# Patient Record
Sex: Female | Born: 1950 | Race: White | Hispanic: No | Marital: Single | Smoking: Never smoker
Health system: Southern US, Community
[De-identification: ages and names within clinical notes are randomized; demographics above are authoritative.]

## PROBLEM LIST (undated history)

## (undated) DIAGNOSIS — I10 Essential (primary) hypertension: Secondary | ICD-10-CM

## (undated) DIAGNOSIS — E785 Hyperlipidemia, unspecified: Secondary | ICD-10-CM

## (undated) DIAGNOSIS — G4700 Insomnia, unspecified: Secondary | ICD-10-CM

## (undated) DIAGNOSIS — A6000 Herpesviral infection of urogenital system, unspecified: Secondary | ICD-10-CM

## (undated) DIAGNOSIS — F419 Anxiety disorder, unspecified: Secondary | ICD-10-CM

## (undated) DIAGNOSIS — E079 Disorder of thyroid, unspecified: Secondary | ICD-10-CM

## (undated) DIAGNOSIS — C801 Malignant (primary) neoplasm, unspecified: Secondary | ICD-10-CM

## (undated) HISTORY — DX: Essential (primary) hypertension: I10

## (undated) HISTORY — DX: Disorder of thyroid, unspecified: E07.9

## (undated) HISTORY — DX: Malignant (primary) neoplasm, unspecified: C80.1

## (undated) HISTORY — DX: Herpesviral infection of urogenital system, unspecified: A60.00

## (undated) HISTORY — DX: Anxiety disorder, unspecified: F41.9

## (undated) HISTORY — DX: Hypercalcemia: E83.52

## (undated) HISTORY — DX: Hyperlipidemia, unspecified: E78.5

## (undated) HISTORY — DX: Insomnia, unspecified: G47.00

## (undated) HISTORY — PX: NASAL SEPTUM SURGERY: SHX37

## (undated) HISTORY — PX: BREAST RECONSTRUCTION: SHX9

---

## 1980-11-20 HISTORY — PX: MASTECTOMY MODIFIED RADICAL: SUR848

## 1993-11-20 HISTORY — PX: TOTAL ABDOMINAL HYSTERECTOMY W/ BILATERAL SALPINGOOPHORECTOMY: SHX83

## 2001-11-20 HISTORY — PX: COLONOSCOPY: SHX174

## 2001-11-20 HISTORY — PX: KNEE SURGERY: SHX244

## 2002-11-20 HISTORY — PX: CATARACT EXTRACTION: SUR2

## 2006-11-20 HISTORY — PX: OTHER SURGICAL HISTORY: SHX169

## 2009-11-20 HISTORY — PX: BREAST IMPLANT EXCHANGE: SHX6296

## 2014-05-24 ENCOUNTER — Emergency Department (HOSPITAL_COMMUNITY): Payer: Managed Care, Other (non HMO)

## 2014-05-24 ENCOUNTER — Emergency Department (HOSPITAL_COMMUNITY)
Admission: EM | Admit: 2014-05-24 | Discharge: 2014-05-24 | Disposition: A | Discharge status: Home / Self Care | Payer: Managed Care, Other (non HMO) | Attending: Emergency Medicine | Admitting: Emergency Medicine

## 2014-05-24 ENCOUNTER — Encounter (HOSPITAL_COMMUNITY): Payer: Self-pay | Admitting: Emergency Medicine

## 2014-05-24 DIAGNOSIS — Z880 Allergy status to penicillin: Secondary | ICD-10-CM | POA: Insufficient documentation

## 2014-05-24 DIAGNOSIS — Y9389 Activity, other specified: Secondary | ICD-10-CM | POA: Insufficient documentation

## 2014-05-24 DIAGNOSIS — S0101XA Laceration without foreign body of scalp, initial encounter: Secondary | ICD-10-CM

## 2014-05-24 DIAGNOSIS — R296 Repeated falls: Secondary | ICD-10-CM | POA: Insufficient documentation

## 2014-05-24 DIAGNOSIS — R42 Dizziness and giddiness: Secondary | ICD-10-CM | POA: Insufficient documentation

## 2014-05-24 DIAGNOSIS — Z79899 Other long term (current) drug therapy: Secondary | ICD-10-CM | POA: Insufficient documentation

## 2014-05-24 DIAGNOSIS — W19XXXA Unspecified fall, initial encounter: Secondary | ICD-10-CM

## 2014-05-24 DIAGNOSIS — Y929 Unspecified place or not applicable: Secondary | ICD-10-CM | POA: Insufficient documentation

## 2014-05-24 DIAGNOSIS — Z23 Encounter for immunization: Secondary | ICD-10-CM | POA: Insufficient documentation

## 2014-05-24 DIAGNOSIS — S0100XA Unspecified open wound of scalp, initial encounter: Secondary | ICD-10-CM | POA: Insufficient documentation

## 2014-05-24 LAB — BASIC METABOLIC PANEL
Anion gap: 18 — ABNORMAL HIGH (ref 5–15)
BUN: 15 mg/dL (ref 6–23)
CO2: 22 mEq/L (ref 19–32)
CREATININE: 1.03 mg/dL (ref 0.50–1.10)
Calcium: 9.5 mg/dL (ref 8.4–10.5)
Chloride: 94 mEq/L — ABNORMAL LOW (ref 96–112)
GFR, EST AFRICAN AMERICAN: 66 mL/min — AB (ref >90)
GFR, EST NON AFRICAN AMERICAN: 57 mL/min — AB (ref >90)
Glucose, Bld: 109 mg/dL — ABNORMAL HIGH (ref 70–99)
Potassium: 3.5 mEq/L — ABNORMAL LOW (ref 3.7–5.3)
Sodium: 134 mEq/L — ABNORMAL LOW (ref 137–147)

## 2014-05-24 LAB — CBC WITH DIFFERENTIAL/PLATELET
Basophils Absolute: 0 10*3/uL (ref 0.0–0.1)
Basophils Relative: 0 % (ref 0–1)
Eosinophils Absolute: 0.2 10*3/uL (ref 0.0–0.7)
Eosinophils Relative: 4 % (ref 0–5)
HEMATOCRIT: 35.1 % — AB (ref 36.0–46.0)
HEMOGLOBIN: 12.2 g/dL (ref 12.0–15.0)
LYMPHS ABS: 2.4 10*3/uL (ref 0.7–4.0)
LYMPHS PCT: 36 % (ref 12–46)
MCH: 30.4 pg (ref 26.0–34.0)
MCHC: 34.8 g/dL (ref 30.0–36.0)
MCV: 87.5 fL (ref 78.0–100.0)
MONO ABS: 1 10*3/uL (ref 0.1–1.0)
MONOS PCT: 15 % — AB (ref 3–12)
NEUTROS PCT: 45 % (ref 43–77)
Neutro Abs: 2.9 10*3/uL (ref 1.7–7.7)
Platelets: 313 10*3/uL (ref 150–400)
RBC: 4.01 MIL/uL (ref 3.87–5.11)
RDW: 13.2 % (ref 11.5–15.5)
WBC: 6.5 10*3/uL (ref 4.0–10.5)

## 2014-05-24 LAB — TROPONIN I

## 2014-05-24 IMAGING — CT CT HEAD W/O CM
2 series · 17 of 30 positions shown, 20 images · non-contrast
Comparison: None.

CLINICAL DATA: Posterior head laceration and dizziness following a
fall.

EXAM:
CT HEAD WITHOUT CONTRAST
TECHNIQUE: Contiguous axial images were obtained from the base of the skull
through the vertex without intravenous contrast.

[Series 2: head w/o · axial · non-contrast · 0.43mm/px · z∈[+8,+122]mm · 9 of 29 slices shown, 12 images]
[im 3/29  brain]
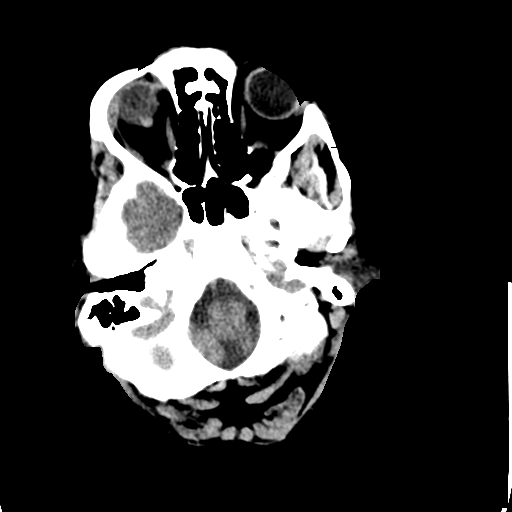
[im 3/29  bone]
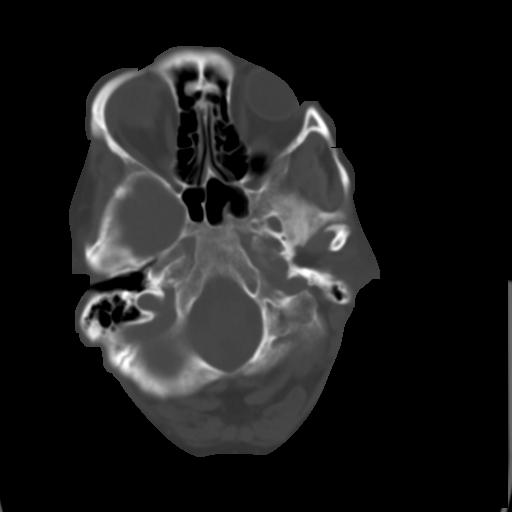
[im 6/29  brain]
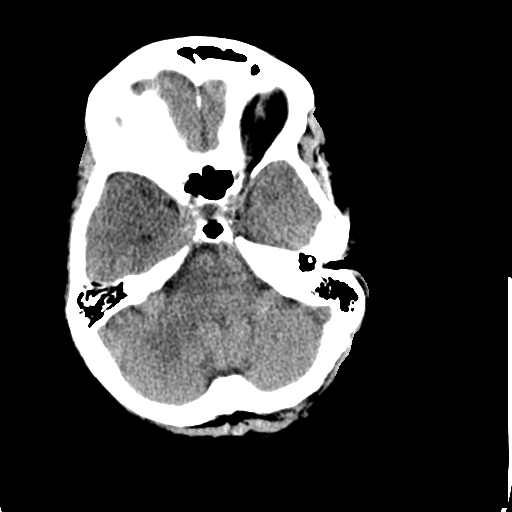
[im 9/29  brain]
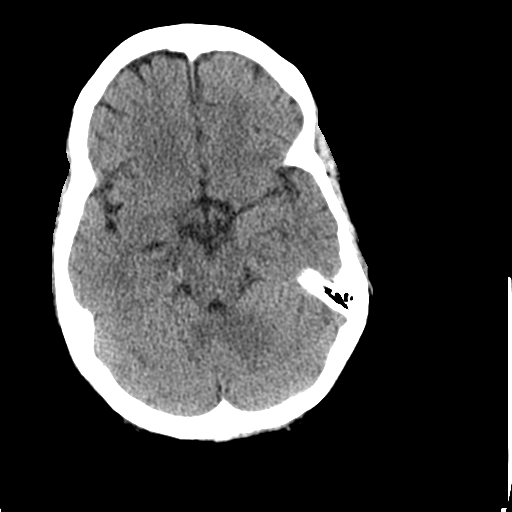
[im 12/29  brain]
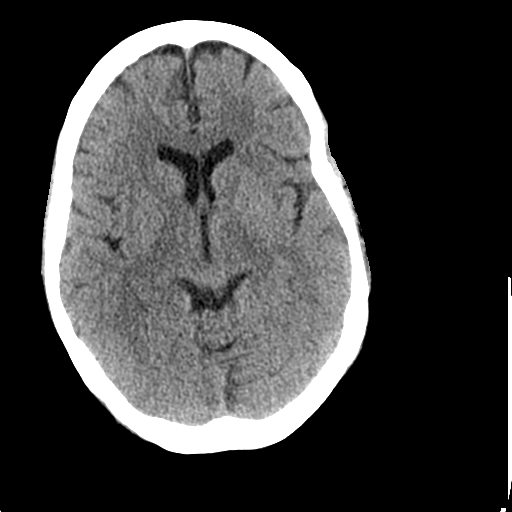
[im 15/29  brain]
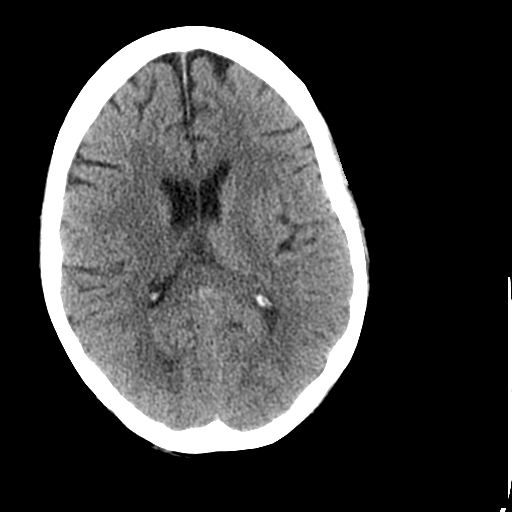
[im 15/29  bone]
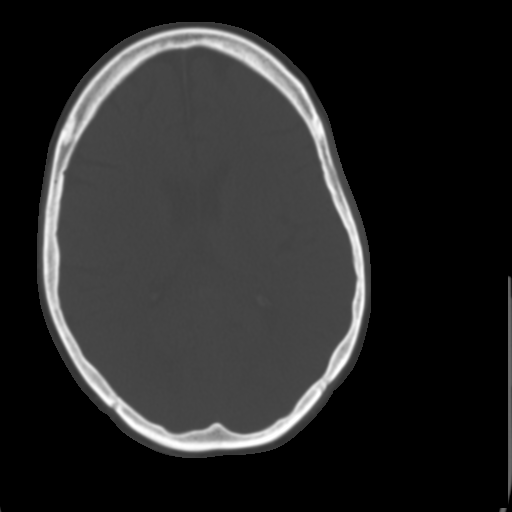
[im 17/29  brain]
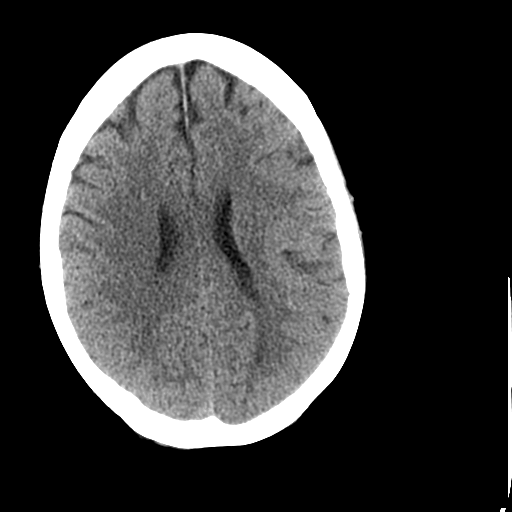
[im 20/29  brain]
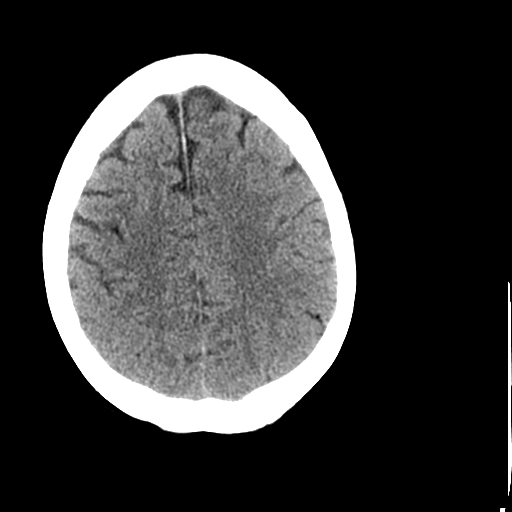
[im 23/29  brain]
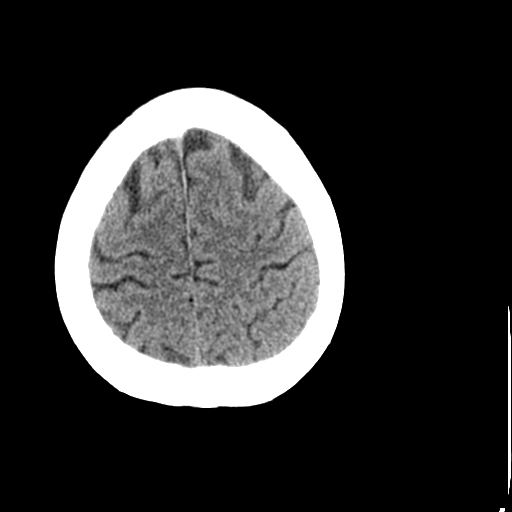
[im 26/29  brain]
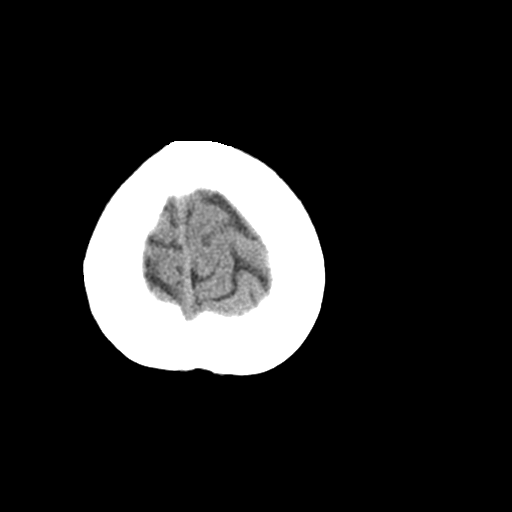
[im 26/29  bone]
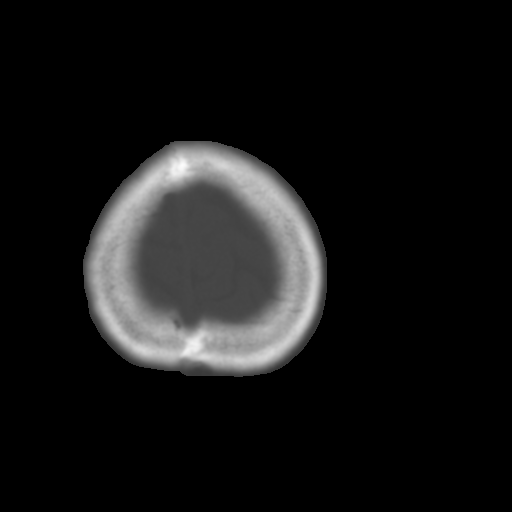

[Series 3: bone windows · axial · 0.43mm/px · z∈[+12,+120]mm · 8 of 48 slices shown]
[im 6/48  bone]
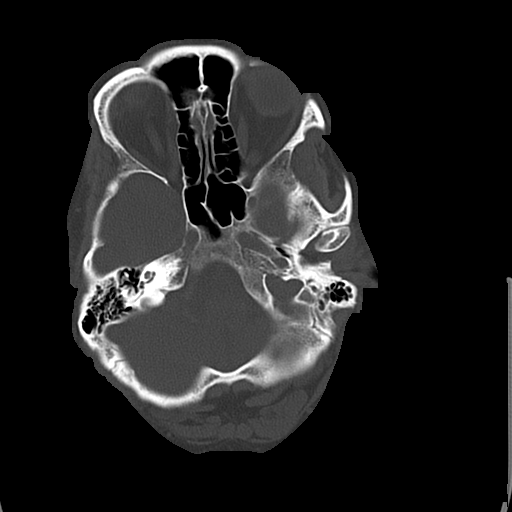
[im 11/48  bone]
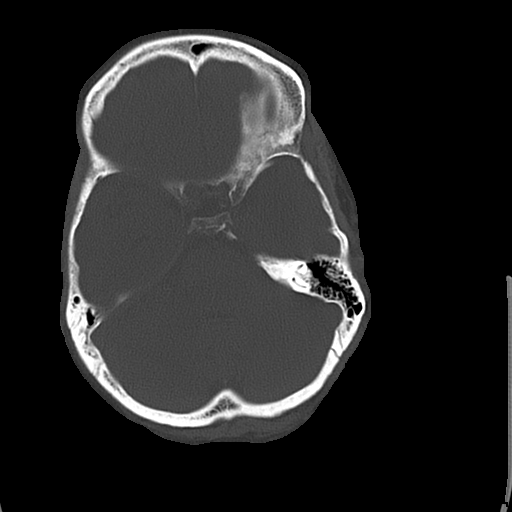
[im 16/48  bone]
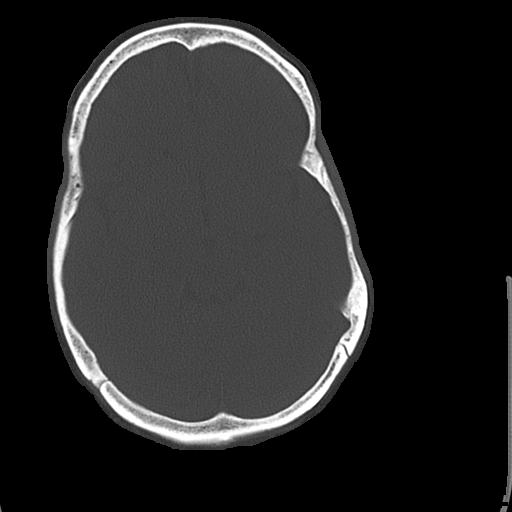
[im 21/48  bone]
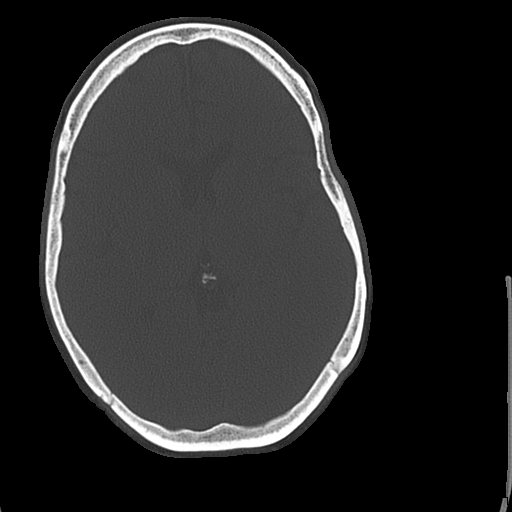
[im 27/48  bone]
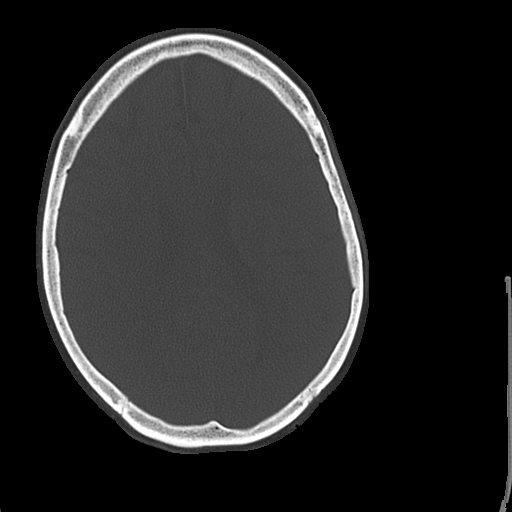
[im 32/48  bone]
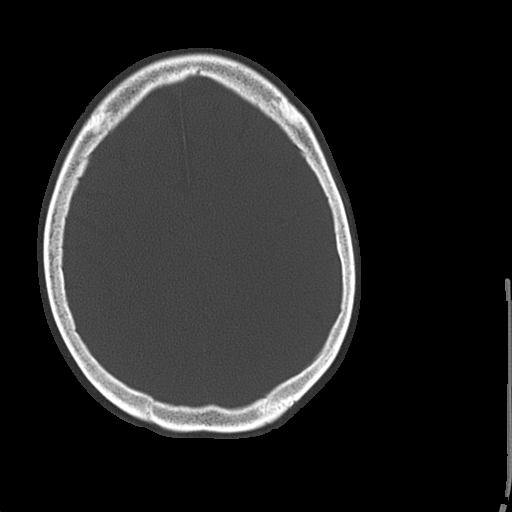
[im 37/48  bone]
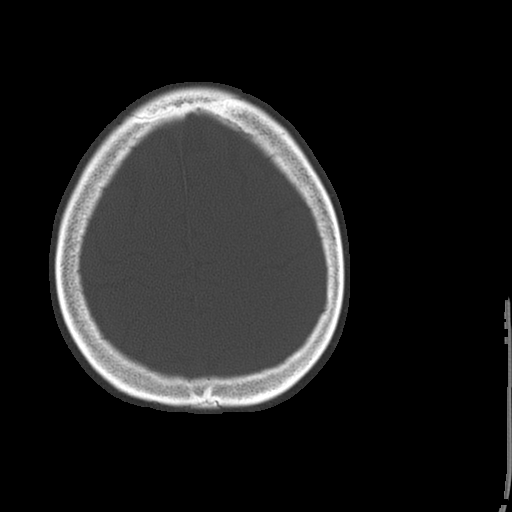
[im 42/48  bone]
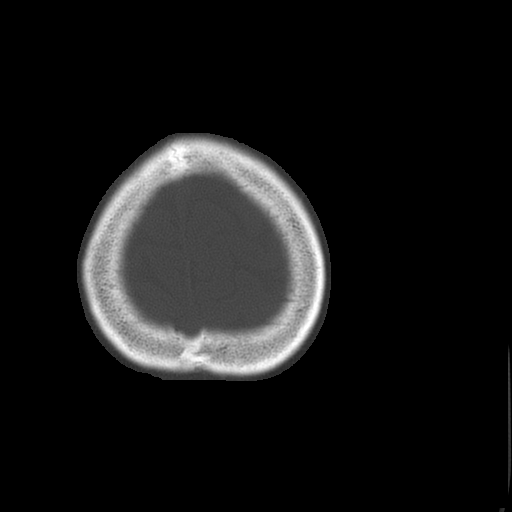

[17 of 30 positions shown; findings below may reference images not displayed]

FINDINGS: Mildly enlarged ventricles and subarachnoid spaces. Left posterior
scalp laceration with a small calcific density and small amount of
soft tissue air. No skull fracture, intracranial hemorrhage or
paranasal sinus air-fluid levels.
IMPRESSION: 1. Left posterior scalp laceration with a tiny calcific superficial
foreign body.
2. No skull fracture or intracranial hemorrhage.
3. Mild atrophy.

## 2014-05-24 MED ORDER — TETANUS-DIPHTH-ACELL PERTUSSIS 5-2.5-18.5 LF-MCG/0.5 IM SUSP
0.5000 mL | Freq: Once | INTRAMUSCULAR | Status: AC
  Administered 2014-05-24: 0.5 mL via INTRAMUSCULAR
  Filled 2014-05-24: qty 0.5

## 2014-05-24 MED ORDER — SODIUM CHLORIDE 0.9 % IV BOLUS (SEPSIS)
1000.0000 mL | INTRAVENOUS | Status: AC
  Administered 2014-05-24: 1000 mL via INTRAVENOUS

## 2014-05-24 NOTE — ED Notes (Signed)
Pt arrived to the ED with a complaint of a fall with a posterior head laceration.  Pt had two glasses of wine and got up caught her foot and fell backwards into the molding inside her house.  Pt got up feeling dizzy.  Pt has a 3 cm laceration to the posterior of her head.  Pt is hypotensive in triage.  Pt denies LOC.  Pt denies pain

## 2014-05-24 NOTE — ED Notes (Signed)
Patient transported to CT 

## 2014-05-24 NOTE — ED Provider Notes (Signed)
CSN: [id number]     Arrival date & time 05/24/14  1938 History   First MD Initiated Contact with Patient 05/24/14 2107     Chief Complaint  Patient presents with  . Fall     (Consider location/radiation/quality/duration/timing/severity/associated sxs/prior Treatment) Patient is a 63 y.o. female presenting with fall. The history is provided by the patient.  Fall This is a new problem. The current episode started 1 to 2 hours ago. Episode frequency: once. The problem has been resolved. Pertinent negatives include no chest pain, no abdominal pain, no headaches and no shortness of breath. Nothing aggravates the symptoms. Nothing relieves the symptoms. She has tried nothing for the symptoms. The treatment provided no relief.    No past medical history on file. No past surgical history on file. No family history on file. History  Substance Use Topics  . Smoking status: Never Smoker   . Smokeless tobacco: Not on file  . Alcohol Use: Yes   OB History   Grav Para Term Preterm Abortions TAB SAB Ect Mult Living                 Review of Systems  Constitutional: Negative for fever and fatigue.  HENT: Negative for congestion and drooling.   Eyes: Negative for pain.  Respiratory: Negative for cough and shortness of breath.   Cardiovascular: Negative for chest pain.  Gastrointestinal: Negative for nausea, vomiting, abdominal pain and diarrhea.  Genitourinary: Negative for dysuria and hematuria.  Musculoskeletal: Negative for back pain, gait problem and neck pain.  Skin: Negative for color change.  Neurological: Negative for dizziness and headaches.  Hematological: Negative for adenopathy.  Psychiatric/Behavioral: Negative for behavioral problems.  All other systems reviewed and are negative.     Allergies  Penicillins and Sulfa antibiotics  Home Medications   Prior to Admission medications   Medication Sig Start Date End Date Taking? Authorizing Provider  amLODipine (NORVASC) 5  MG tablet Take 5 mg by mouth daily.   Yes Historical Provider, MD  candesartan-hydrochlorothiazide (ATACAND HCT) 16-12.5 MG per tablet Take 2 tablets by mouth daily.   Yes Historical Provider, MD  levothyroxine (SYNTHROID, LEVOTHROID) 25 MCG tablet Take 25 mcg by mouth daily before breakfast.   Yes Historical Provider, MD  lovastatin (MEVACOR) 10 MG tablet Take 10 mg by mouth daily.   Yes Historical Provider, MD   BP 93/55  Pulse 81  Temp(Src) 98.1 F (36.7 C) (Oral)  Resp 24  Ht 5\' 2"  (1.575 m)  Wt 137 lb (62.143 kg)  BMI 25.05 kg/m2  SpO2 97% Physical Exam  Nursing note and vitals reviewed. Constitutional: She is oriented to person, place, and time. She appears well-developed and well-nourished.  HENT:  Head: Normocephalic.  Mouth/Throat: No oropharyngeal exudate.  4 cm vertical laceration to the posterior aspect of the head.  Eyes: Conjunctivae and EOM are normal. Pupils are equal, round, and reactive to light.  Neck: Normal range of motion. Neck supple.  Cardiovascular: Normal rate, regular rhythm, normal heart sounds and intact distal pulses.  Exam reveals no gallop and no friction rub.   No murmur heard. Pulmonary/Chest: Effort normal and breath sounds normal. No respiratory distress. She has no wheezes.  Abdominal: Soft. Bowel sounds are normal. There is no tenderness. There is no rebound and no guarding.  Musculoskeletal: Normal range of motion. She exhibits no edema and no tenderness.  Neurological: She is alert and oriented to person, place, and time.  alert, oriented x3 speech: normal in context and  clarity memory: intact grossly cranial nerves II-XII: intact motor strength: full proximally and distally no involuntary movements or tremors sensation: intact to light touch diffusely  cerebellar: finger-to-nose and heel-to-shin intact gait: normal forwards and backwards, normal tandem gait   Skin: Skin is warm and dry.  Psychiatric: She has a normal mood and affect.  Her behavior is normal.    ED Course  LACERATION REPAIR Date/Time: 05/25/2014 7:37 PM Performed by: Pamella Pert, S Authorized by: Pamella Pert, S Consent: Verbal consent obtained. written consent not obtained. Risks and benefits: risks, benefits and alternatives were discussed Consent given by: patient Patient understanding: patient states understanding of the procedure being performed Required items: required blood products, implants, devices, and special equipment available Patient identity confirmed: verbally with patient, arm band, provided demographic data and hospital-assigned identification number Body area: head/neck Location details: scalp Laceration length: 4 cm Foreign bodies: no foreign bodies Tendon involvement: none Nerve involvement: none Vascular damage: no Anesthesia: local infiltration Local anesthetic: lidocaine 1% with epinephrine Patient sedated: no Preparation: Patient was prepped and draped in the usual sterile fashion. Irrigation solution: saline Irrigation method: syringe Amount of cleaning: standard Debridement: none Degree of undermining: none Skin closure: staples Number of sutures: 4 Patient tolerance: Patient tolerated the procedure well with no immediate complications.   (including critical care time) Labs Review Labs Reviewed  CBC WITH DIFFERENTIAL - Abnormal; Notable for the following:    HCT 35.1 (*)    Monocytes Relative 15 (*)    All other components within normal limits  BASIC METABOLIC PANEL - Abnormal; Notable for the following:    Sodium 134 (*)    Potassium 3.5 (*)    Chloride 94 (*)    Glucose, Bld 109 (*)    GFR calc non Af Amer 57 (*)    GFR calc Af Amer 66 (*)    Anion gap 18 (*)    All other components within normal limits  TROPONIN I    Imaging Review Ct Head Wo Contrast  05/24/2014   CLINICAL DATA:  Posterior head laceration and dizziness following a fall.  EXAM: CT HEAD WITHOUT CONTRAST  TECHNIQUE:  Contiguous axial images were obtained from the base of the skull through the vertex without intravenous contrast.  COMPARISON:  None.  FINDINGS: Mildly enlarged ventricles and subarachnoid spaces. Left posterior scalp laceration with a small calcific density and small amount of soft tissue air. No skull fracture, intracranial hemorrhage or paranasal sinus air-fluid levels.  IMPRESSION: 1. Left posterior scalp laceration with a tiny calcific superficial foreign body. 2. No skull fracture or intracranial hemorrhage. 3. Mild atrophy.   Electronically Signed   By: Enrique Sack M.D.   On: 05/24/2014 22:09     EKG Interpretation   Date/Time:  Sunday May 24 2014 19:51:20 EDT Ventricular Rate:  93 PR Interval:  186 QRS Duration: 62 QT Interval:  340 QTC Calculation: 423 R Axis:   23 Text Interpretation:  Sinus rhythm LAE, consider biatrial enlargement no  previous for comparison Confirmed by Miquel Lamson  MD, Rekha Hobbins ([id number]) on  05/24/2014 9:14:59 PM      MDM   Final diagnoses:  Fall, initial encounter  Laceration of scalp, initial encounter    9:44 PM 63 y.o. female with a history of hypertension who presents with a mechanical fall. She states that she had 2 glasses of wine and was getting up from a recliner when her foot got caught and she fell backwards hitting her head on the ground. She  denies any loss of consciousness. She does have a 4 cm laceration to the posterior aspect of her head. She was initially hypotensive in triage but her blood pressure is improving without treatment and on my exam and is currently 95/60. She does have a history of hypertension and takes blood pressure medicines. She currently has some mild burning pain where the laceration is but denies any other pain. She denies any chest pain or shortness of breath. She is afebrile and vital signs are otherwise unremarkable. Will get orthostatics, IV fluid, and screening labs. Patient has otherwise been well and I doubt there is an  underlying infectious process.  Repaired lac to posterior scalp w/ 4 staples. Calcific density noted on CT. This was not visualized on my exam, but the wound was explored and irrigated thoroughly w/out evidence of fb.   11:27 PM: BP improved w/ IVF. Possibly a component of dehydration. Pt is well appearing and jovial on exam. I interpreted/reviewed the labs and/or imaging which were non-contributory.  I have discussed the diagnosis/risks/treatment options with the patient and believe the pt to be eligible for discharge home to follow-up with pcp this week to review her BP meds and to keep a log of her BP's. We also discussed returning to the ED immediately if new or worsening sx occur. We discussed the sx which are most concerning (e.g., recurrent falls, severe HA, fever) that necessitate immediate return. Medications administered to the patient during their visit and any new prescriptions provided to the patient are listed below.  Medications given during this visit Medications  Tdap (BOOSTRIX) injection 0.5 mL (0.5 mLs Intramuscular Given 05/24/14 2124)  sodium chloride 0.9 % bolus 1,000 mL (0 mLs Intravenous Stopped 05/24/14 2248)    New Prescriptions   No medications on file     Blanchard Kelch, MD 05/25/14 1942

## 2014-07-31 ENCOUNTER — Ambulatory Visit: Payer: Managed Care, Other (non HMO) | Admitting: Internal Medicine

## 2014-08-11 ENCOUNTER — Encounter: Payer: Self-pay | Admitting: Internal Medicine

## 2014-08-11 ENCOUNTER — Ambulatory Visit (INDEPENDENT_AMBULATORY_CARE_PROVIDER_SITE_OTHER): Payer: Managed Care, Other (non HMO) | Admitting: Internal Medicine

## 2014-08-11 DIAGNOSIS — E039 Hypothyroidism, unspecified: Secondary | ICD-10-CM

## 2014-08-11 MED ORDER — LEVOTHYROXINE SODIUM 75 MCG PO TABS: 75.0000 ug | ORAL_TABLET | Freq: Every day | ORAL | Status: AC

## 2014-08-11 MED ORDER — CANDESARTAN CILEXETIL 32 MG PO TABS: 32.0000 mg | ORAL_TABLET | Freq: Every day | ORAL | Status: DC

## 2014-08-11 NOTE — Progress Notes (Signed)
Patient ID: Brooke Dudley, female   DOB: 17-Nov-1951, 63 y.o.   MRN: [id number]   HPI  Brooke Dudley is a 63 y.o.-year-old female, referred by her PCP, Dr. Orland Penman, in consultation for hypercalcemia and for management of hypothyroidism.  Pt was dx with hypercalcemia several years ago (?10), before moving here from [place].  I reviewed pt's pertinent labs: 07/12/2014: Ca 10.7 (8.6-10.3), ionized calcium 5.4 (4.5-5.6); iPTH 22 Lab Results  Component Value Date   CALCIUM 9.5 05/24/2014   Pt is on HCTZ 12.5 mg daily for many years!  No h/o vitamin D deficiency. No available vit D levels.  Pt is on on calcium and vitamin D (on MVI)  No h/o osteoporosis, had a L ankle fracture 12/2012. Last DEXA 2013 - normal.   No h/o kidney stones.  No h/o CKD. Last BUN/Cr (07/12/2014): 8/0.97. Previously Lab Results  Component Value Date   BUN 15 05/24/2014   CREATININE 1.03 05/24/2014   She lost ~40 lbs over 4 years - 10-12 lbs in last year. She was started on antihypertensives before she lost the weight and now BP on the low side. She had an episode of LOC 2/2 dehydration in 05/2014.  She has a h/o Breast cancer s/p R mastectomy 1982 and L 2008 (prophylactic)  Pt does not have a FH of hypercalcemia, pituitary tumors, thyroid cancer.   She also has hypothyroidism. - dx in ~2005 - last TSH (07/12/2014): 0.44 - On LT4 25 mcg daily, taken: - fasting - in am - >30 min before b'fast - no PPI, Ca, Fe - takes MVI around the same time  ROS: Constitutional: no weight gain/loss, no fatigue, no subjective hyperthermia/hypothermia, + poor sleep Eyes: no blurry vision, no xerophthalmia ENT: no sore throat, no nodules palpated in throat, no dysphagia/odynophagia, no hoarseness Cardiovascular: no CP/SOB/palpitations/leg swelling Respiratory: no cough/SOB Gastrointestinal: no N/V/D/C Musculoskeletal: no muscle/joint aches Skin: no rashes, + easy bruising Neurological: no  tremors/numbness/tingling/dizziness Psychiatric: no depression/+ anxiety   History   Social History  . Marital Status: Single    Spouse Name: N/A    Number of Children: 0   Occupational History  . Retired drug rep   Social History Main Topics  . Smoking status: Never Smoker   . Smokeless tobacco: Not on file  . Alcohol Use: Yes, occasionally  . Drug Use: No  Exercise: swimming, will start yoga  Past Medical History  Diagnosis Date  . Hypertension   . Hyperlipidemia   . Anxiety   . Insomnia   . Hypercalcemia   . Cancer     Breast  . Genital herpes   . Thyroid disease     hypothyroidism    Past Surgical History  Procedure Laterality Date  . Mastectomy modified radical  1982    Right breast  . Knee surgery  2003  . Cataract extraction  2004    both eyes  . Colonoscopy  2003  . Total abdominal hysterectomy w/ bilateral salpingoophorectomy  1995    dysplasia  . Prophylactic mastectomy  2008    left breast  . Nasal septum surgery    . Breast reconstruction      Right breast   . Breast implant exchange  2011   Current Outpatient Prescriptions on File Prior to Visit  Medication Sig Dispense Refill  . amLODipine (NORVASC) 5 MG tablet Take 5 mg by mouth daily.      . candesartan-hydrochlorothiazide (ATACAND HCT) 16-12.5 MG per tablet Take 2 tablets by mouth daily.      Marland Kitchen  levothyroxine (SYNTHROID, LEVOTHROID) 25 MCG tablet Take 25 mcg by mouth daily before breakfast.      . lovastatin (MEVACOR) 10 MG tablet Take 10 mg by mouth daily.       No current facility-administered medications on file prior to visit.   Allergies  Allergen Reactions  . Penicillins Rash  . Sulfa Antibiotics Rash and Hives   FH: see HPI  PE: BP 106/76  Pulse 91  Temp(Src) 98.2 F (36.8 C) (Oral)  Resp 12  Ht 5\' 2"  (1.575 m)  Wt 147 lb 12.8 oz (67.042 kg)  BMI 27.03 kg/m2  SpO2 97% Wt Readings from Last 3 Encounters:  08/11/14 147 lb 12.8 oz (67.042 kg)  05/24/14 137 lb (62.143  kg)   Constitutional: slightly overweight, in NAD. No kyphosis. Eyes: PERRLA, EOMI, no exophthalmos ENT: moist mucous membranes, no thyromegaly, no cervical lymphadenopathy Cardiovascular: RRR, No MRG Respiratory: CTA B Gastrointestinal: abdomen soft, NT, ND, BS+ Musculoskeletal: no deformities, strength intact in all 4 Skin: moist, warm, no rashes Neurological: no tremor with outstretched hands, DTR normal in all 4  Assessment: 1. Hypercalcemia  2. Hypothyroidism  Plan: 1. Hypercalcemia Patient had a recent elevated calcium, however, the ionized calcium was normal and the PTH was low-normal, ruling out an parathyroid problem. It is unclear whether she has vitamin D deficiency (but I would expect the PTH to be higher if she had low vitamin D). No apparent complications from hypercalcemia: no h/o nephrolithiasis, no osteoporosis. No constipation, abdominal pain, depression, bone pain. - I discussed with the patient about the physiology of calcium and parathyroid hormone and explained that HCTZ is the most likely cause for her slightly elevated calcium and low normal PTH, however, we need to stop HCTZ first and recheck the labs to make sure. She agrees to do this and I called in Candesartan to her pharmacy. I did not start Lasix, but advised her to check her BP at home and let me know if BP > 135/80 - we may need 20 mg Lasix daily in that case.  - I will check a calcium level in 6 weeks after stopping HCTZ - I will see the patient back if the above labs are abnormal  2. Hypothyroidism - I advised her to take the thyroid hormone every day, with water, >30 minutes before breakfast, separated by >4 hours from acid reflux medications, calcium, iron, multivitamins. She will move the MVI at night. - will repeat the TFTs when she comes back for the calcium check.  Orders Placed This Encounter  Procedures  . TSH  . T4, free  . Calcium, ionized  . Basic metabolic panel

## 2014-08-11 NOTE — Patient Instructions (Addendum)
Please stop Atacand and start Candesartan 32 mg daily. Check your BP at home and call me if the readings are >135/80. Please come back for labs in ~6 weeks. We will schedule another visits if the labs are abnormal.

## 2014-08-14 ENCOUNTER — Ambulatory Visit: Payer: Managed Care, Other (non HMO) | Admitting: Internal Medicine

## 2014-08-14 DIAGNOSIS — E039 Hypothyroidism, unspecified: Secondary | ICD-10-CM | POA: Insufficient documentation

## 2014-08-27 ENCOUNTER — Other Ambulatory Visit: Payer: Self-pay | Admitting: *Deleted

## 2014-08-27 ENCOUNTER — Telehealth: Payer: Self-pay | Admitting: *Deleted

## 2014-08-27 MED ORDER — CANDESARTAN CILEXETIL 32 MG PO TABS: 32.0000 mg | ORAL_TABLET | Freq: Every day | ORAL | Status: DC

## 2014-08-27 NOTE — Telephone Encounter (Signed)
OK to refill with 3 refills. 

## 2014-08-27 NOTE — Telephone Encounter (Signed)
Done

## 2014-08-27 NOTE — Telephone Encounter (Signed)
Please read note below and advise if ok to refill.

## 2014-09-03 ENCOUNTER — Telehealth: Payer: Self-pay | Admitting: Internal Medicine

## 2014-09-03 NOTE — Telephone Encounter (Signed)
Pt calling regarding the med change for adican 30 pillls she does not have enough to hold her until her next appt she will need 46 more pills to last her

## 2014-09-03 NOTE — Telephone Encounter (Signed)
Called pt and lvm advising her that her rx sent to the pharmacy on 08/27/14 was for 30 tablets with 3 refills. Advised pt that she should have enough to last until her next appt. Advised pt to call her pharmacy.

## 2014-09-11 ENCOUNTER — Telehealth: Payer: Self-pay | Admitting: Internal Medicine

## 2014-09-11 NOTE — Telephone Encounter (Signed)
Called pharmacy to be sure that the dosage directions were the same as to what  Dr Cruzita Lederer sent in on 10/8. It was. Returned call to pt and left a detailed message for her. Her old rx for Atacand was 2 tabs 16 mg 12.5. This is not what Dr Cruzita Lederer sent in.

## 2014-09-11 NOTE — Telephone Encounter (Signed)
Patient is very confused on her medication  Atacand 2 tabs 16 mg 12.5 plus diuretic   Her pharmacy has Atacand 1 tab 16 mg 12.5 [facility]   Please advise patient  She states that she is driving on the road and if she cannot answer please leave a detailed message   Thank you

## 2014-09-22 ENCOUNTER — Other Ambulatory Visit: Payer: Managed Care, Other (non HMO)

## 2014-09-23 ENCOUNTER — Other Ambulatory Visit: Payer: Managed Care, Other (non HMO)

## 2014-11-23 ENCOUNTER — Telehealth: Payer: Self-pay | Admitting: Internal Medicine

## 2014-11-25 NOTE — Telephone Encounter (Signed)
error 

## 2014-11-30 ENCOUNTER — Other Ambulatory Visit (INDEPENDENT_AMBULATORY_CARE_PROVIDER_SITE_OTHER): Payer: Managed Care, Other (non HMO)

## 2014-11-30 ENCOUNTER — Other Ambulatory Visit: Payer: Managed Care, Other (non HMO)

## 2014-11-30 DIAGNOSIS — E039 Hypothyroidism, unspecified: Secondary | ICD-10-CM

## 2014-11-30 LAB — T4, FREE: Free T4: 0.71 ng/dL (ref 0.60–1.60)

## 2014-11-30 LAB — TSH: TSH: 2.99 u[IU]/mL (ref 0.35–4.50)

## 2014-11-30 LAB — BASIC METABOLIC PANEL
BUN: 14 mg/dL (ref 6–23)
CO2: 25 mEq/L (ref 19–32)
Calcium: 9.8 mg/dL (ref 8.4–10.5)
Chloride: 99 mEq/L (ref 96–112)
Creatinine, Ser: 0.8 mg/dL (ref 0.4–1.2)
GFR: 77 mL/min (ref >60.00)
Glucose, Bld: 104 mg/dL — ABNORMAL HIGH (ref 70–99)
Potassium: 4.1 mEq/L (ref 3.5–5.1)
SODIUM: 135 meq/L (ref 135–145)

## 2014-12-01 ENCOUNTER — Encounter: Payer: Self-pay | Admitting: *Deleted

## 2014-12-01 LAB — CALCIUM, IONIZED: Calcium, Ion: 1.27 mmol/L (ref 1.12–1.32)

## 2014-12-03 ENCOUNTER — Telehealth: Payer: Self-pay | Admitting: Internal Medicine

## 2014-12-03 ENCOUNTER — Other Ambulatory Visit: Payer: Self-pay | Admitting: Internal Medicine

## 2014-12-03 NOTE — Telephone Encounter (Signed)
Faxed labs to Dr Nnodi's office [phone].

## 2014-12-03 NOTE — Telephone Encounter (Signed)
Please forward labs to Dr. Orland Penman

## 2015-03-06 ENCOUNTER — Other Ambulatory Visit: Payer: Self-pay | Admitting: Internal Medicine

## 2015-03-06 NOTE — Telephone Encounter (Signed)
Please communicate to pt that further refills are per PCP.

## 2016-02-07 ENCOUNTER — Other Ambulatory Visit: Payer: Self-pay | Admitting: Obstetrics & Gynecology

## 2016-02-07 DIAGNOSIS — N6320 Unspecified lump in the left breast, unspecified quadrant: Secondary | ICD-10-CM

## 2016-02-11 ENCOUNTER — Ambulatory Visit
Admission: RE | Admit: 2016-02-11 | Discharge: 2016-02-11 | Disposition: A | Discharge status: Home / Self Care | Payer: Managed Care, Other (non HMO) | Source: Ambulatory Visit | Attending: Obstetrics & Gynecology | Admitting: Obstetrics & Gynecology

## 2016-02-11 DIAGNOSIS — N6320 Unspecified lump in the left breast, unspecified quadrant: Secondary | ICD-10-CM

## 2017-01-05 DIAGNOSIS — L820 Inflamed seborrheic keratosis: Secondary | ICD-10-CM | POA: Diagnosis not present

## 2017-01-05 DIAGNOSIS — L821 Other seborrheic keratosis: Secondary | ICD-10-CM | POA: Diagnosis not present

## 2017-01-05 DIAGNOSIS — L918 Other hypertrophic disorders of the skin: Secondary | ICD-10-CM | POA: Diagnosis not present

## 2017-07-25 DIAGNOSIS — Z23 Encounter for immunization: Secondary | ICD-10-CM | POA: Diagnosis not present

## 2017-07-25 DIAGNOSIS — H938X2 Other specified disorders of left ear: Secondary | ICD-10-CM | POA: Diagnosis not present

## 2017-07-25 DIAGNOSIS — R0981 Nasal congestion: Secondary | ICD-10-CM | POA: Diagnosis not present

## 2017-07-30 DIAGNOSIS — Z0000 Encounter for general adult medical examination without abnormal findings: Secondary | ICD-10-CM | POA: Diagnosis not present

## 2017-07-30 DIAGNOSIS — Z86000 Personal history of in-situ neoplasm of breast: Secondary | ICD-10-CM | POA: Diagnosis not present

## 2017-07-30 DIAGNOSIS — E039 Hypothyroidism, unspecified: Secondary | ICD-10-CM | POA: Diagnosis not present

## 2017-07-30 DIAGNOSIS — E785 Hyperlipidemia, unspecified: Secondary | ICD-10-CM | POA: Diagnosis not present

## 2017-07-30 DIAGNOSIS — E663 Overweight: Secondary | ICD-10-CM | POA: Diagnosis not present

## 2017-07-30 DIAGNOSIS — H6123 Impacted cerumen, bilateral: Secondary | ICD-10-CM | POA: Diagnosis not present

## 2017-07-30 DIAGNOSIS — A6004 Herpesviral vulvovaginitis: Secondary | ICD-10-CM | POA: Diagnosis not present

## 2017-07-30 DIAGNOSIS — I10 Essential (primary) hypertension: Secondary | ICD-10-CM | POA: Diagnosis not present

## 2017-07-30 DIAGNOSIS — Z23 Encounter for immunization: Secondary | ICD-10-CM | POA: Diagnosis not present

## 2017-07-30 DIAGNOSIS — J309 Allergic rhinitis, unspecified: Secondary | ICD-10-CM | POA: Diagnosis not present

## 2017-07-30 DIAGNOSIS — G4700 Insomnia, unspecified: Secondary | ICD-10-CM | POA: Diagnosis not present

## 2017-07-30 DIAGNOSIS — Z780 Asymptomatic menopausal state: Secondary | ICD-10-CM | POA: Diagnosis not present

## 2017-12-27 DIAGNOSIS — M8588 Other specified disorders of bone density and structure, other site: Secondary | ICD-10-CM | POA: Diagnosis not present

## 2017-12-27 DIAGNOSIS — E2839 Other primary ovarian failure: Secondary | ICD-10-CM | POA: Diagnosis not present

## 2018-03-13 DIAGNOSIS — L814 Other melanin hyperpigmentation: Secondary | ICD-10-CM | POA: Diagnosis not present

## 2018-03-13 DIAGNOSIS — L821 Other seborrheic keratosis: Secondary | ICD-10-CM | POA: Diagnosis not present

## 2018-03-13 DIAGNOSIS — L570 Actinic keratosis: Secondary | ICD-10-CM | POA: Diagnosis not present

## 2018-03-13 DIAGNOSIS — L918 Other hypertrophic disorders of the skin: Secondary | ICD-10-CM | POA: Diagnosis not present

## 2018-03-13 DIAGNOSIS — L819 Disorder of pigmentation, unspecified: Secondary | ICD-10-CM | POA: Diagnosis not present

## 2018-03-13 DIAGNOSIS — D2261 Melanocytic nevi of right upper limb, including shoulder: Secondary | ICD-10-CM | POA: Diagnosis not present

## 2018-08-22 DIAGNOSIS — L821 Other seborrheic keratosis: Secondary | ICD-10-CM | POA: Diagnosis not present

## 2018-08-22 DIAGNOSIS — L918 Other hypertrophic disorders of the skin: Secondary | ICD-10-CM | POA: Diagnosis not present

## 2018-08-22 DIAGNOSIS — D1801 Hemangioma of skin and subcutaneous tissue: Secondary | ICD-10-CM | POA: Diagnosis not present

## 2018-08-22 DIAGNOSIS — L812 Freckles: Secondary | ICD-10-CM | POA: Diagnosis not present

## 2018-08-22 DIAGNOSIS — L814 Other melanin hyperpigmentation: Secondary | ICD-10-CM | POA: Diagnosis not present

## 2018-10-22 DIAGNOSIS — L03012 Cellulitis of left finger: Secondary | ICD-10-CM | POA: Diagnosis not present

## 2018-10-22 DIAGNOSIS — L089 Local infection of the skin and subcutaneous tissue, unspecified: Secondary | ICD-10-CM | POA: Diagnosis not present

## 2018-11-04 DIAGNOSIS — E785 Hyperlipidemia, unspecified: Secondary | ICD-10-CM | POA: Diagnosis not present

## 2018-11-04 DIAGNOSIS — Z23 Encounter for immunization: Secondary | ICD-10-CM | POA: Diagnosis not present

## 2018-11-04 DIAGNOSIS — Z0000 Encounter for general adult medical examination without abnormal findings: Secondary | ICD-10-CM | POA: Diagnosis not present

## 2018-11-04 DIAGNOSIS — E039 Hypothyroidism, unspecified: Secondary | ICD-10-CM | POA: Diagnosis not present

## 2018-11-04 DIAGNOSIS — I10 Essential (primary) hypertension: Secondary | ICD-10-CM | POA: Diagnosis not present

## 2018-11-04 DIAGNOSIS — G4700 Insomnia, unspecified: Secondary | ICD-10-CM | POA: Diagnosis not present

## 2018-11-04 DIAGNOSIS — F419 Anxiety disorder, unspecified: Secondary | ICD-10-CM | POA: Diagnosis not present

## 2019-05-20 DIAGNOSIS — M79672 Pain in left foot: Secondary | ICD-10-CM | POA: Diagnosis not present

## 2019-06-10 DIAGNOSIS — M25561 Pain in right knee: Secondary | ICD-10-CM | POA: Diagnosis not present

## 2019-06-10 DIAGNOSIS — M1711 Unilateral primary osteoarthritis, right knee: Secondary | ICD-10-CM | POA: Diagnosis not present

## 2019-07-01 DIAGNOSIS — M25561 Pain in right knee: Secondary | ICD-10-CM | POA: Diagnosis not present

## 2019-08-01 DIAGNOSIS — R0683 Snoring: Secondary | ICD-10-CM | POA: Diagnosis not present

## 2019-08-01 DIAGNOSIS — R0681 Apnea, not elsewhere classified: Secondary | ICD-10-CM | POA: Diagnosis not present

## 2019-09-04 DIAGNOSIS — R0681 Apnea, not elsewhere classified: Secondary | ICD-10-CM | POA: Diagnosis not present

## 2019-09-16 DIAGNOSIS — M79672 Pain in left foot: Secondary | ICD-10-CM | POA: Diagnosis not present

## 2019-10-09 DIAGNOSIS — L570 Actinic keratosis: Secondary | ICD-10-CM | POA: Diagnosis not present

## 2019-10-09 DIAGNOSIS — L218 Other seborrheic dermatitis: Secondary | ICD-10-CM | POA: Diagnosis not present

## 2019-10-09 DIAGNOSIS — D485 Neoplasm of uncertain behavior of skin: Secondary | ICD-10-CM | POA: Diagnosis not present

## 2019-10-09 DIAGNOSIS — D0462 Carcinoma in situ of skin of left upper limb, including shoulder: Secondary | ICD-10-CM | POA: Diagnosis not present

## 2019-10-09 DIAGNOSIS — L814 Other melanin hyperpigmentation: Secondary | ICD-10-CM | POA: Diagnosis not present

## 2019-10-09 DIAGNOSIS — L821 Other seborrheic keratosis: Secondary | ICD-10-CM | POA: Diagnosis not present

## 2019-10-28 DIAGNOSIS — D0462 Carcinoma in situ of skin of left upper limb, including shoulder: Secondary | ICD-10-CM | POA: Diagnosis not present

## 2020-02-05 DIAGNOSIS — H43393 Other vitreous opacities, bilateral: Secondary | ICD-10-CM | POA: Diagnosis not present

## 2020-03-10 DIAGNOSIS — R42 Dizziness and giddiness: Secondary | ICD-10-CM | POA: Diagnosis not present

## 2020-03-10 DIAGNOSIS — M542 Cervicalgia: Secondary | ICD-10-CM | POA: Diagnosis not present

## 2020-03-16 ENCOUNTER — Other Ambulatory Visit: Payer: Self-pay | Admitting: Family Medicine

## 2020-03-16 DIAGNOSIS — M542 Cervicalgia: Secondary | ICD-10-CM

## 2020-03-17 ENCOUNTER — Ambulatory Visit
Admission: RE | Admit: 2020-03-17 | Discharge: 2020-03-17 | Disposition: A | Discharge status: Home / Self Care | Payer: Medicare Other | Source: Ambulatory Visit | Attending: Family Medicine | Admitting: Family Medicine

## 2020-03-17 ENCOUNTER — Other Ambulatory Visit: Payer: Self-pay | Admitting: Family Medicine

## 2020-03-17 ENCOUNTER — Other Ambulatory Visit: Payer: Self-pay

## 2020-03-17 DIAGNOSIS — R519 Headache, unspecified: Secondary | ICD-10-CM | POA: Diagnosis not present

## 2020-03-17 DIAGNOSIS — M542 Cervicalgia: Secondary | ICD-10-CM

## 2020-03-17 DIAGNOSIS — R42 Dizziness and giddiness: Secondary | ICD-10-CM | POA: Diagnosis not present

## 2020-03-17 IMAGING — MR MR HEAD W/O CM
10 series · 48 of 48 positions shown · non-contrast
Comparison: Prior CT from [DATE].

CLINICAL DATA: Initial evaluation for worsening/new vertigo,
imbalance, and headaches for 1 month. Pain at bottom of right side
of head. History of hypertension, hyperlipidemia.



[Series 2: t1_se_sag · sagittal · 5.0mm · 0.45mm/px · 1 of 19 slices shown]
[im 1/19]
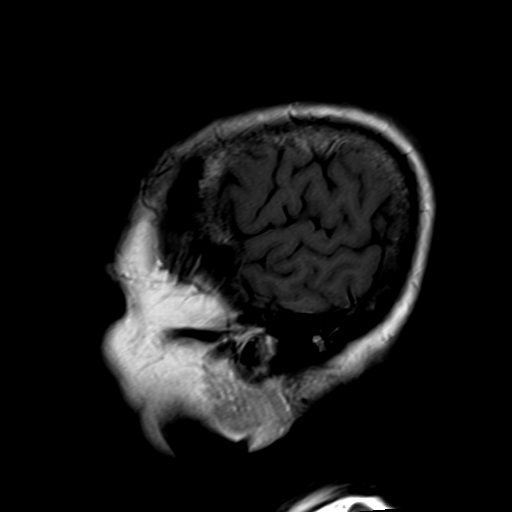

[Series 3: t2_tse_tra_512 · axial · 5.0mm · 0.72mm/px · z∈[-50,+99]mm · 3 of 26 slices shown]
[im 1/26]
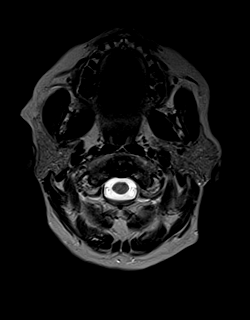
[im 13/26]
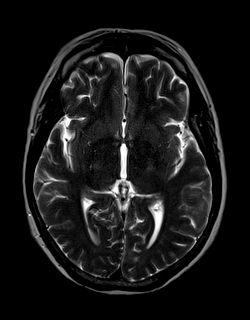
[im 26/26]
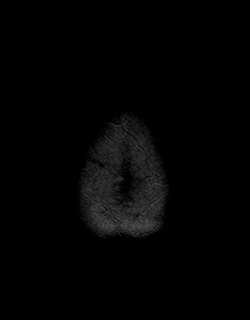

[Series 4: ep2d_diff_ · axial · 3.0mm · 1.80mm/px · z∈[-45,+98]mm · 9 of 97 slices shown]
[im 1/97]
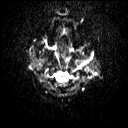
[im 13/97]
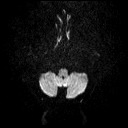
[im 25/97]
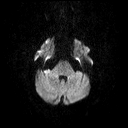
[im 37/97]
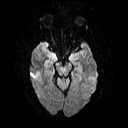
[im 49/97]
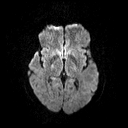
[im 61/97]
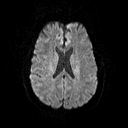
[im 73/97]
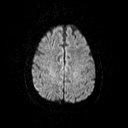
[im 85/97]
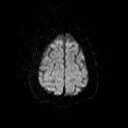
[im 97/97]
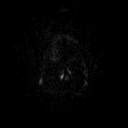

[Series 5: ep2d_diff__adc · axial · 3.0mm · 1.80mm/px · z∈[-45,+98]mm · 5 of 49 slices shown]
[im 1/49]
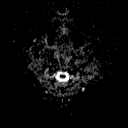
[im 13/49]
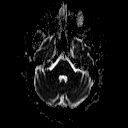
[im 25/49]
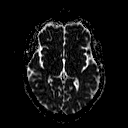
[im 37/49]
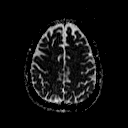
[im 49/49]
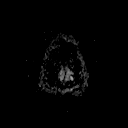

[Series 6: ep2d_diff_cor · coronal · 5.0mm · 1.77mm/px · 5 of 50 slices shown]
[im 1/50]
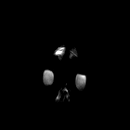
[im 13/50]
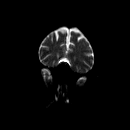
[im 25/50]
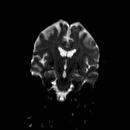
[im 37/50]
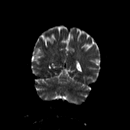
[im 50/50]
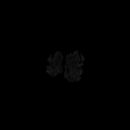

[Series 7: ep2d_diff_cor_adc · coronal · 5.0mm · 1.77mm/px · 2 of 25 slices shown]
[im 1/25]
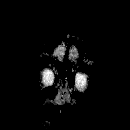
[im 25/25]
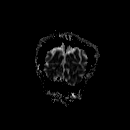

[Series 9: swi_images · axial · 4.0mm · 0.90mm/px · z∈[-45,+94]mm · 3 of 36 slices shown]
[im 1/36]
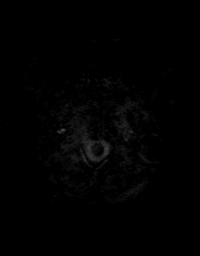
[im 18/36]
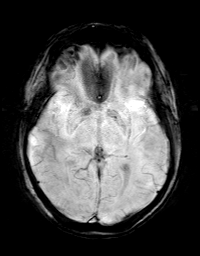
[im 36/36]
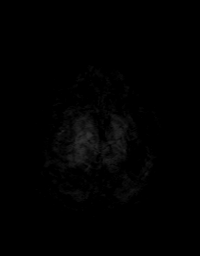

[Series 10: FLAIR · axial · 3.0mm · 0.43mm/px · z∈[-52,+103]mm · 3 of 27 slices shown]
[im 1/27]
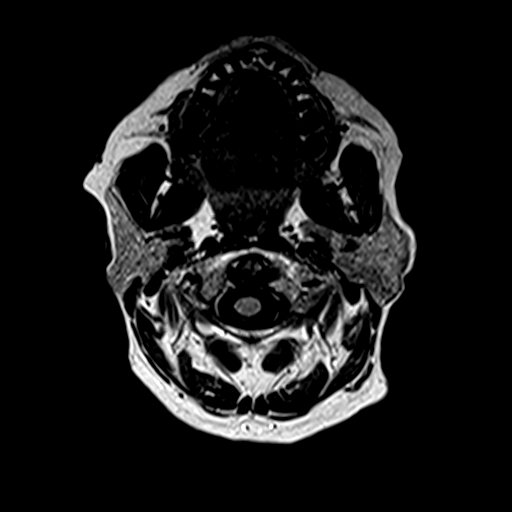
[im 14/27]
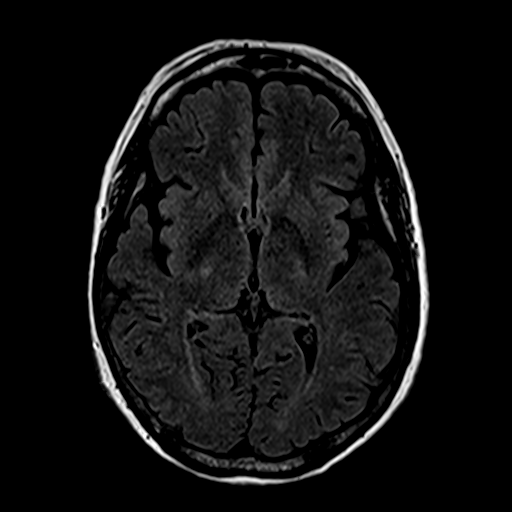
[im 27/27]
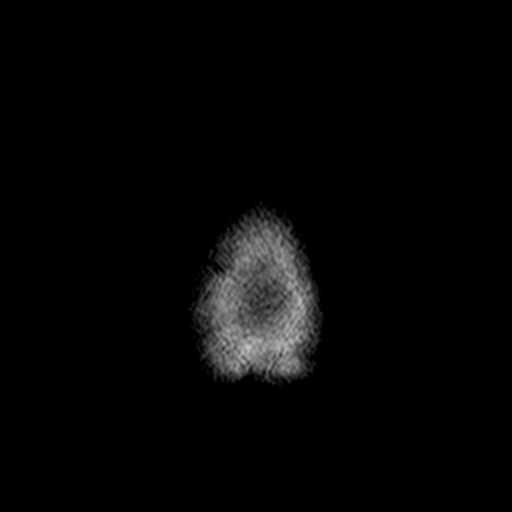

[Series 11: t1_mpr_tra · axial · 1.0mm · 0.72mm/px · z∈[-46,+96]mm · 14 of 144 slices shown]
[im 1/144]
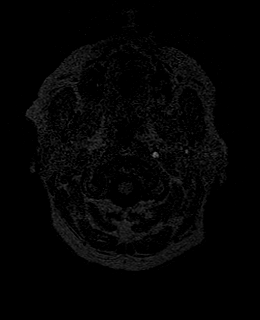
[im 12/144]
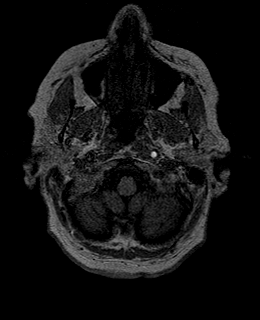
[im 23/144]
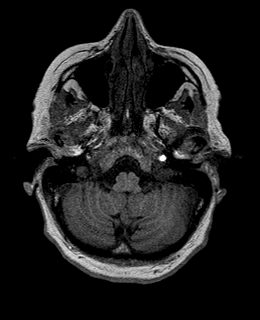
[im 34/144]
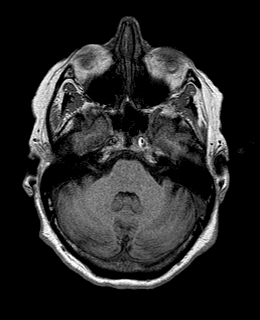
[im 45/144]
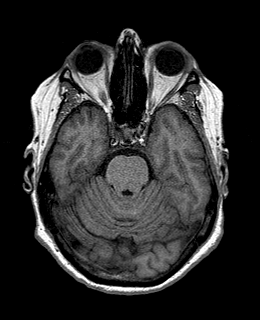
[im 56/144]
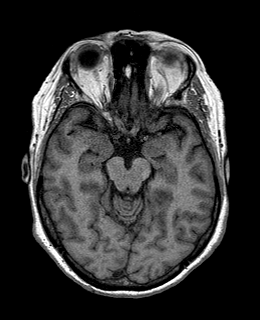
[im 67/144]
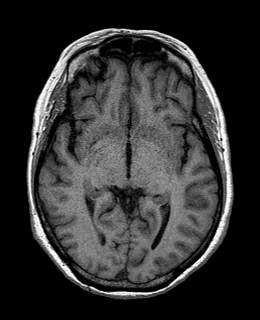
[im 78/144]
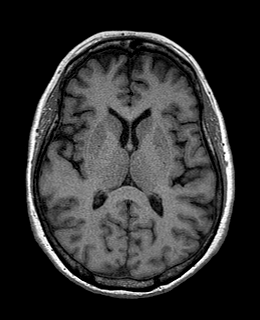
[im 89/144]
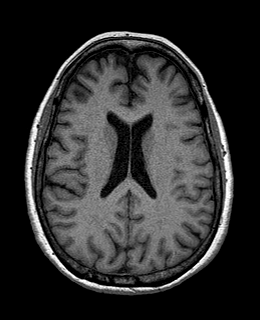
[im 100/144]
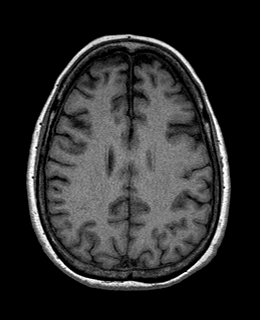
[im 111/144]
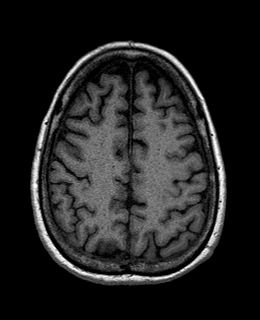
[im 122/144]
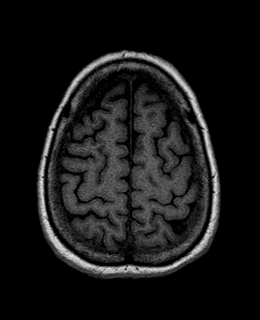
[im 133/144]
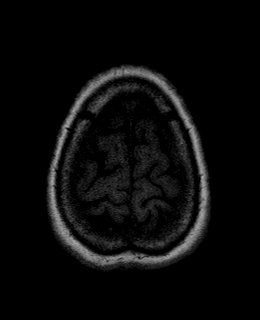
[im 144/144]
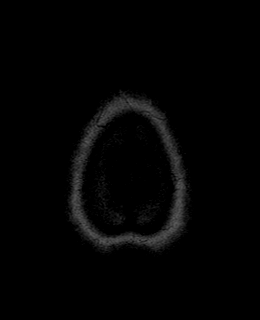

[Series 12: T2 · coronal · 5.0mm · 0.45mm/px · 3 of 26 slices shown]
[im 1/26]
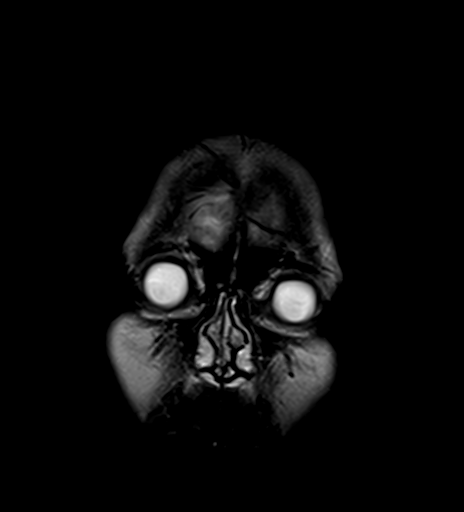
[im 13/26]
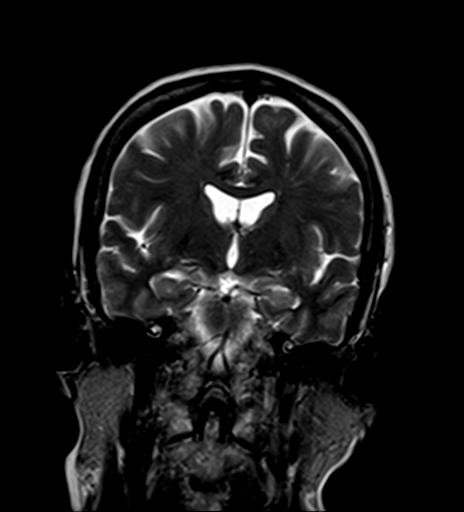
[im 26/26]
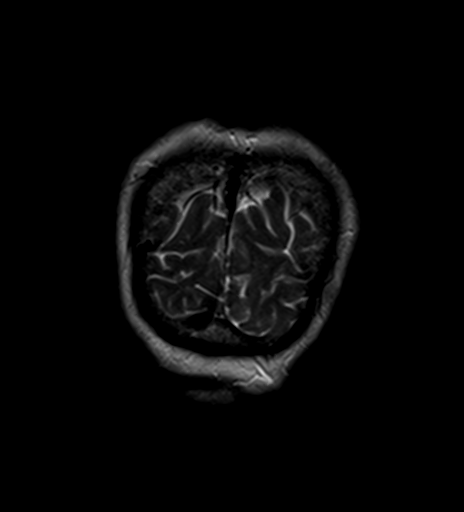

[48 of 48 positions shown; findings below may reference images not displayed]

FINDINGS: MRI HEAD FINDINGS

Brain: Cerebral volume within normal limits for age. No significant
cerebral white matter disease. No abnormal foci of restricted
diffusion to suggest acute or subacute ischemia. Gray-white matter
differentiation maintained. No encephalomalacia to suggest chronic
cortical infarction. No foci of susceptibility artifact to suggest
acute or chronic intracranial hemorrhage.

No mass lesion, midline shift or mass effect. Ventricles normal size
without hydrocephalus. No extra-axial fluid collection. Pituitary
gland suprasellar region normal. Midline structures intact.

Vascular: Major intracranial vascular flow voids are maintained.

Skull and upper cervical spine: Craniocervical junction within
normal limits. Bone marrow signal intensity normal. No scalp soft
tissue abnormality.

Sinuses/Orbits: Patient status post bilateral ocular lens
replacement. Globes and orbital soft tissues demonstrate no acute
finding. Paranasal sinuses are clear. No mastoid effusion. Inner ear
structures grossly normal.

Other: None.

MRA HEAD FINDINGS

ANTERIOR CIRCULATION:

Visualized distal cervical segments of the internal carotid arteries
are widely patent with symmetric antegrade flow. Petrous, cavernous,
and supraclinoid ICAs patent without stenosis or other abnormality.
A1 segments widely patent. Normal anterior communicating artery
complex. Anterior cerebral arteries widely patent to their distal
aspects. No M1 stenosis or occlusion. Normal MCA bifurcations.
Distal MCA branches well perfused and symmetric.

POSTERIOR CIRCULATION:

Vertebral arteries widely patent to the vertebrobasilar junction
without stenosis. Right vertebral artery slightly dominant. Patent
right PICA. Left PICA not seen. Basilar widely patent to its distal
aspect without stenosis. Superior cerebral arteries patent
bilaterally. Left PCA primarily supplied via the basilar. Right PCA
supplied via a hypoplastic right P1 segment and robust right
posterior communicating artery. PCAs well perfused to their distal
aspects without stenosis.

No intracranial aneurysm.

MRA NECK FINDINGS

Examination technically limited by lack of IV contrast.

AORTIC ARCH: Aortic arch and origin of the great vessels not well
assessed on this examination. Partially visualized subclavian
arteries widely patent.

RIGHT CAROTID SYSTEM: Visualized right CCA widely patent to the
bifurcation without stenosis. Mild atheromatous irregularity about
the right bifurcation without significant stenosis. Visualized right
ICA widely patent without stenosis or occlusion.

LEFT CAROTID SYSTEM: Visualized left CCA widely patent to the
bifurcation. No significant atheromatous narrowing about the left
bifurcation. Visualized left ICA widely patent without stenosis or
occlusion.

VERTEBRAL ARTERIES: Vertebral arteries not well assessed proximally.
Visualized portions of the vertebral arteries widely patent without
stenosis or occlusion.
IMPRESSION: MRI HEAD IMPRESSION:

Normal brain MRI for age. No acute intracranial abnormality. No
findings to explain patient's symptoms identified.

MRA HEAD IMPRESSION:

Normal intracranial MRA. No large vessel occlusion or
hemodynamically significant stenosis. No aneurysm.

MRA NECK IMPRESSION:

Grossly normal MRA of the neck. Please note examination is somewhat
technically limited by lack of IV contrast.

## 2020-03-17 IMAGING — MR MR MRA HEAD W/O CM
1 series · 20 of 48 positions shown · non-contrast
Comparison: Prior CT from [DATE].

CLINICAL DATA: Initial evaluation for worsening/new vertigo,
imbalance, and headaches for 1 month. Pain at bottom of right side
of head. History of hypertension, hyperlipidemia.



[Series 2: [id number] new · axial · 0.7mm · 0.35mm/px · z∈[-39,+46]mm · 20 of 130 slices shown]
[im 1/130]
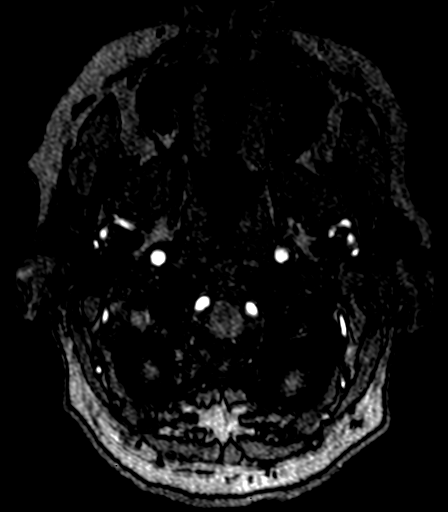
[im 3/130]
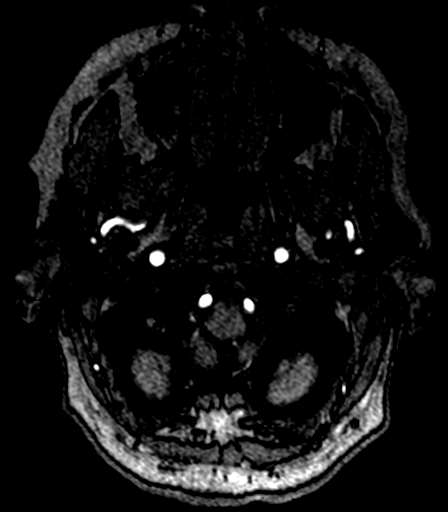
[im 6/130]
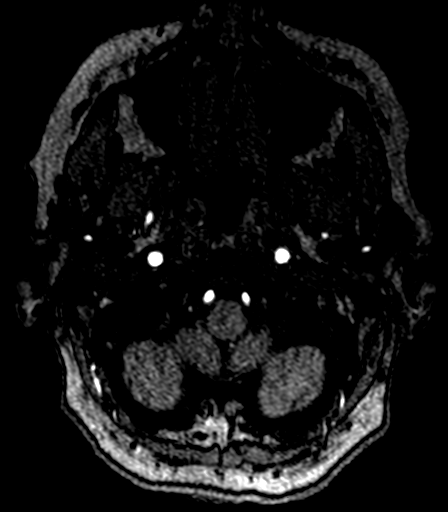
[im 9/130]
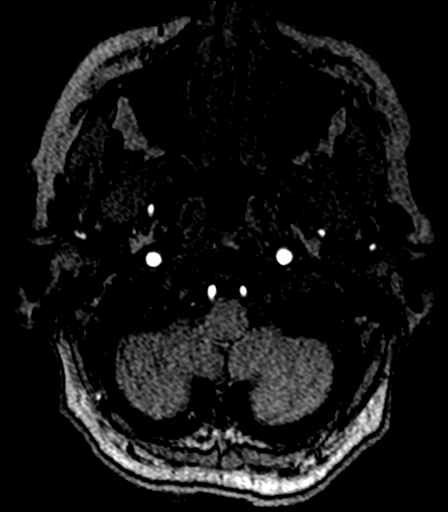
[im 11/130]
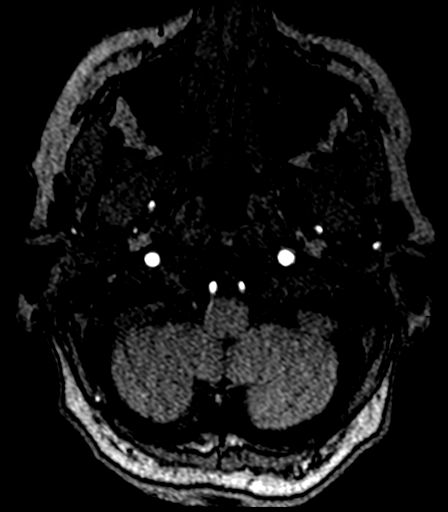
[im 14/130]
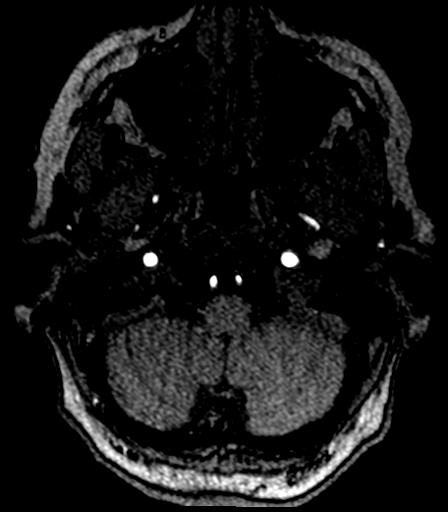
[im 17/130]
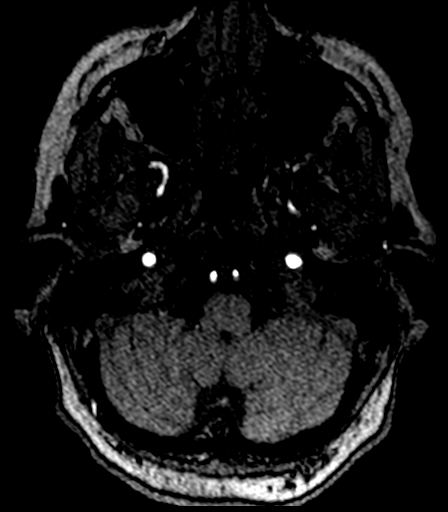
[im 20/130]
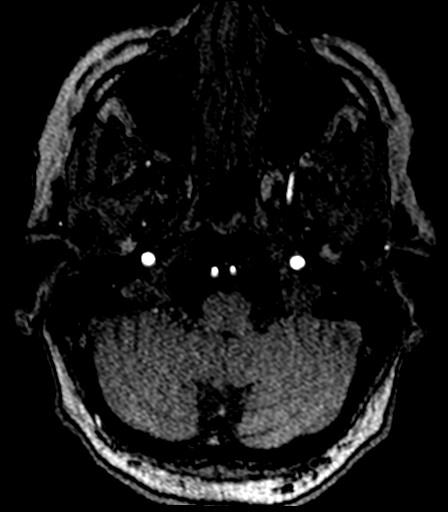
[im 22/130]
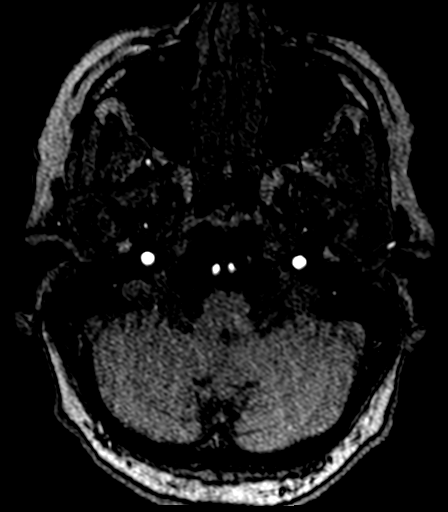
[im 25/130]
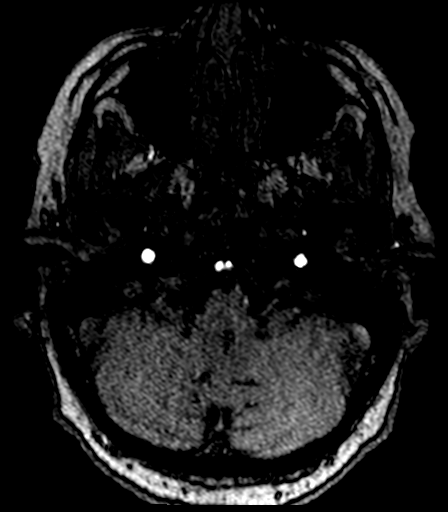
[im 28/130]
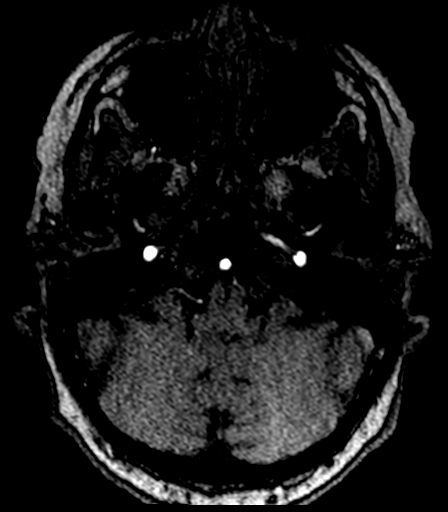
[im 31/130]
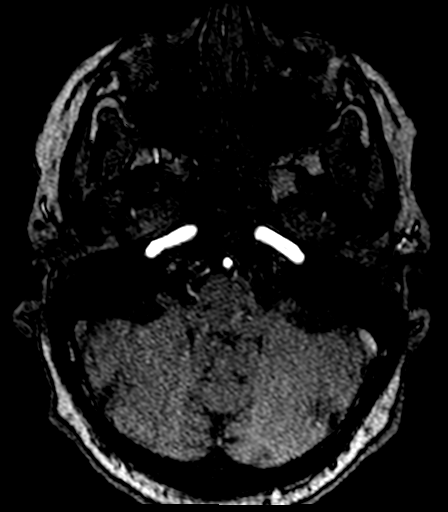
[im 42/130]
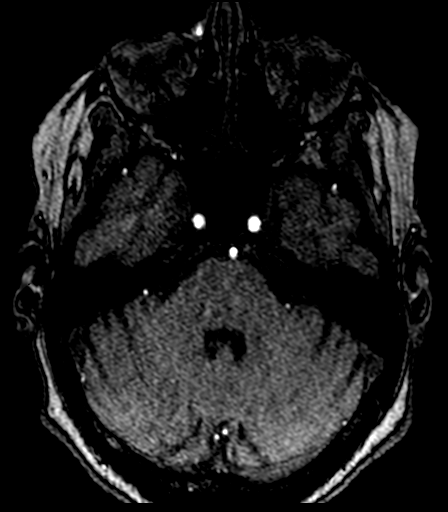
[im 58/130]
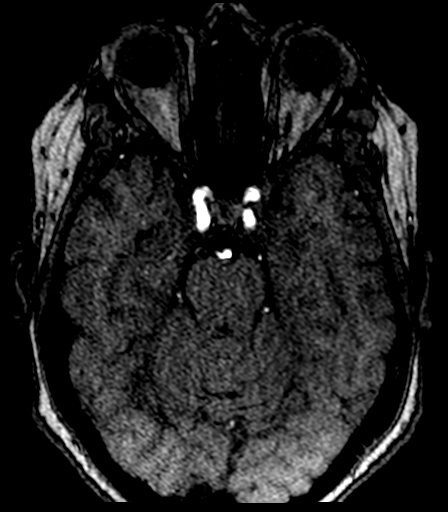
[im 66/130]
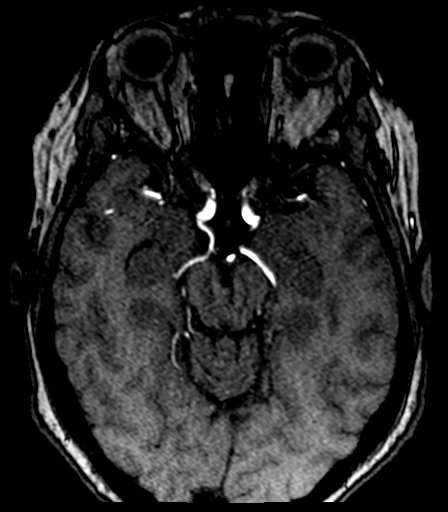
[im 75/130]
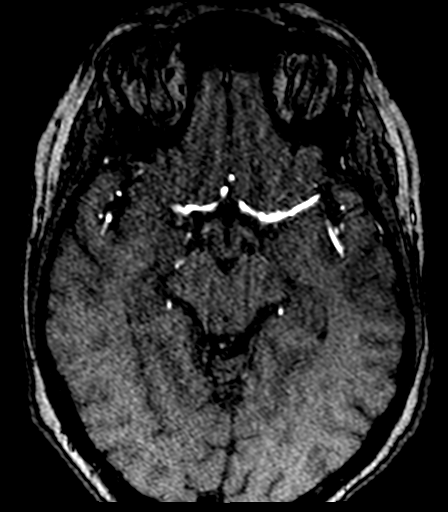
[im 91/130]
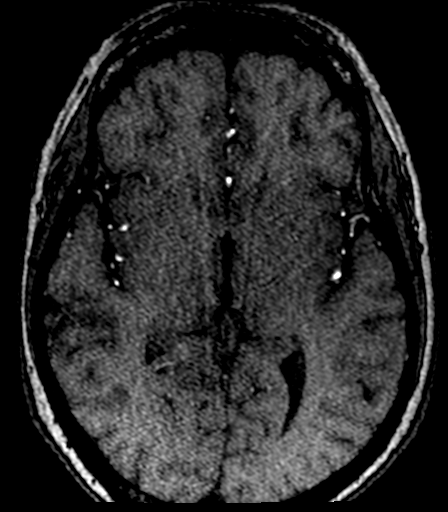
[im 108/130]
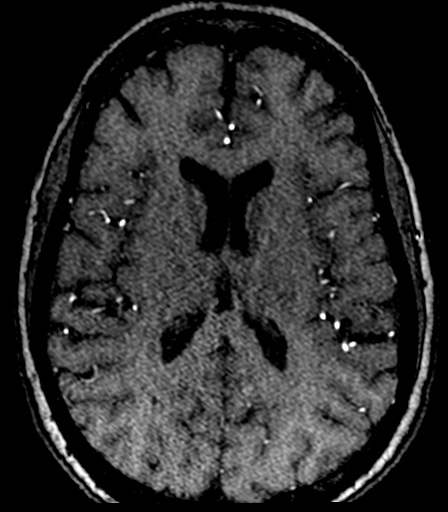
[im 110/130]
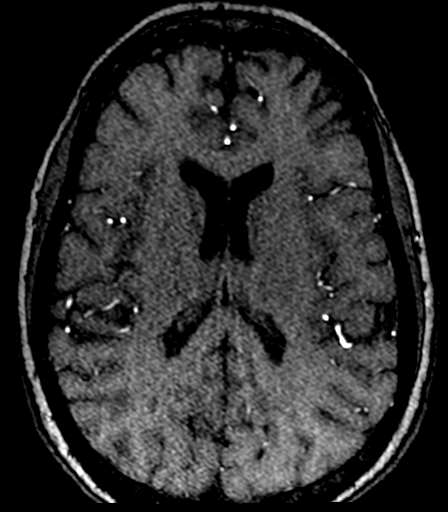
[im 124/130]
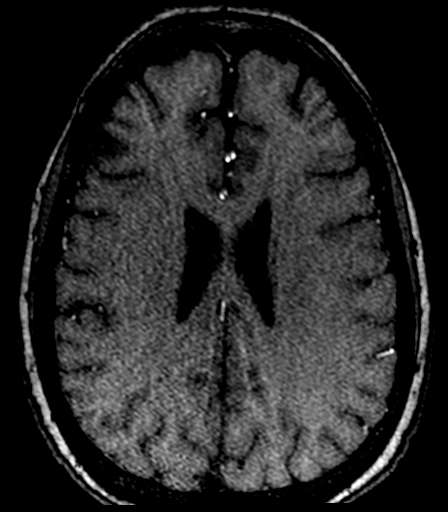

[20 of 48 positions shown; findings below may reference images not displayed]

FINDINGS: MRI HEAD FINDINGS

Brain: Cerebral volume within normal limits for age. No significant
cerebral white matter disease. No abnormal foci of restricted
diffusion to suggest acute or subacute ischemia. Gray-white matter
differentiation maintained. No encephalomalacia to suggest chronic
cortical infarction. No foci of susceptibility artifact to suggest
acute or chronic intracranial hemorrhage.

No mass lesion, midline shift or mass effect. Ventricles normal size
without hydrocephalus. No extra-axial fluid collection. Pituitary
gland suprasellar region normal. Midline structures intact.

Vascular: Major intracranial vascular flow voids are maintained.

Skull and upper cervical spine: Craniocervical junction within
normal limits. Bone marrow signal intensity normal. No scalp soft
tissue abnormality.

Sinuses/Orbits: Patient status post bilateral ocular lens
replacement. Globes and orbital soft tissues demonstrate no acute
finding. Paranasal sinuses are clear. No mastoid effusion. Inner ear
structures grossly normal.

Other: None.

MRA HEAD FINDINGS

ANTERIOR CIRCULATION:

Visualized distal cervical segments of the internal carotid arteries
are widely patent with symmetric antegrade flow. Petrous, cavernous,
and supraclinoid ICAs patent without stenosis or other abnormality.
A1 segments widely patent. Normal anterior communicating artery
complex. Anterior cerebral arteries widely patent to their distal
aspects. No M1 stenosis or occlusion. Normal MCA bifurcations.
Distal MCA branches well perfused and symmetric.

POSTERIOR CIRCULATION:

Vertebral arteries widely patent to the vertebrobasilar junction
without stenosis. Right vertebral artery slightly dominant. Patent
right PICA. Left PICA not seen. Basilar widely patent to its distal
aspect without stenosis. Superior cerebral arteries patent
bilaterally. Left PCA primarily supplied via the basilar. Right PCA
supplied via a hypoplastic right P1 segment and robust right
posterior communicating artery. PCAs well perfused to their distal
aspects without stenosis.

No intracranial aneurysm.

MRA NECK FINDINGS

Examination technically limited by lack of IV contrast.

AORTIC ARCH: Aortic arch and origin of the great vessels not well
assessed on this examination. Partially visualized subclavian
arteries widely patent.

RIGHT CAROTID SYSTEM: Visualized right CCA widely patent to the
bifurcation without stenosis. Mild atheromatous irregularity about
the right bifurcation without significant stenosis. Visualized right
ICA widely patent without stenosis or occlusion.

LEFT CAROTID SYSTEM: Visualized left CCA widely patent to the
bifurcation. No significant atheromatous narrowing about the left
bifurcation. Visualized left ICA widely patent without stenosis or
occlusion.

VERTEBRAL ARTERIES: Vertebral arteries not well assessed proximally.
Visualized portions of the vertebral arteries widely patent without
stenosis or occlusion.
IMPRESSION: MRI HEAD IMPRESSION:

Normal brain MRI for age. No acute intracranial abnormality. No
findings to explain patient's symptoms identified.

MRA HEAD IMPRESSION:

Normal intracranial MRA. No large vessel occlusion or
hemodynamically significant stenosis. No aneurysm.

MRA NECK IMPRESSION:

Grossly normal MRA of the neck. Please note examination is somewhat
technically limited by lack of IV contrast.

## 2020-03-17 IMAGING — MR MR MRA NECK W/O CM
4 series · 46 of 48 positions shown · non-contrast
Comparison: Prior CT from [DATE].

CLINICAL DATA: Initial evaluation for worsening/new vertigo,
imbalance, and headaches for 1 month. Pain at bottom of right side
of head. History of hypertension, hyperlipidemia.



[Series 3: (id) · sagittal · 3.0mm · 0.98mm/px · 25 of 70 slices shown (1 of 2)]
[im 1/70]
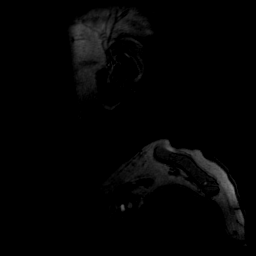
[im 3/70]
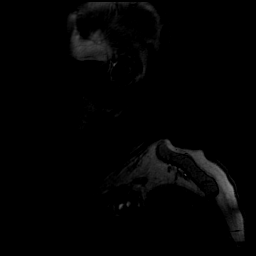
[im 6/70]
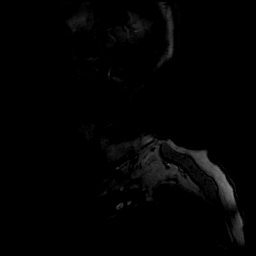
[im 9/70]
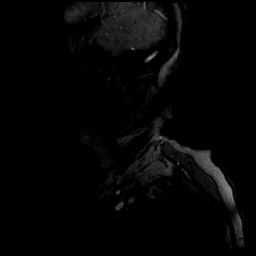
[im 12/70]
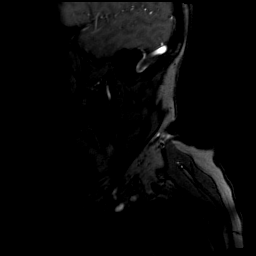
[im 15/70]
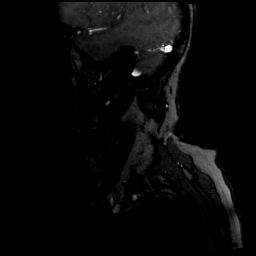
[im 18/70]
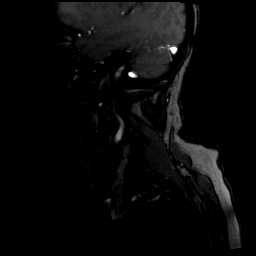
[im 21/70]
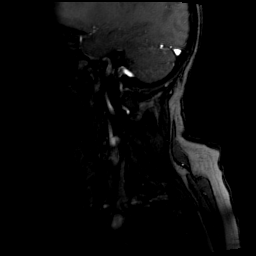
[im 24/70]
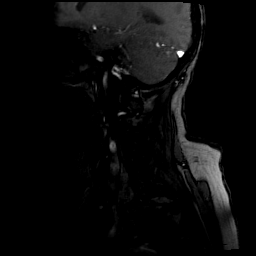
[im 26/70]
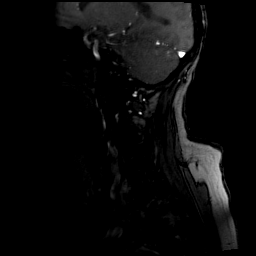
[im 29/70]
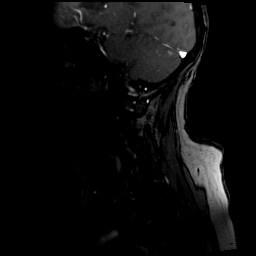
[im 32/70]
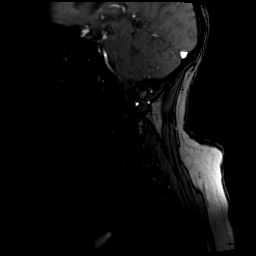
[im 35/70]
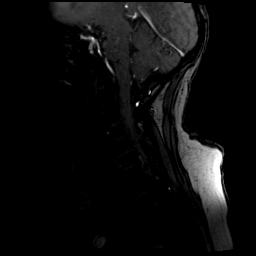
[im 38/70]
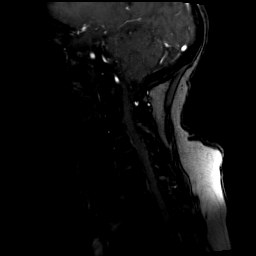
[im 41/70]
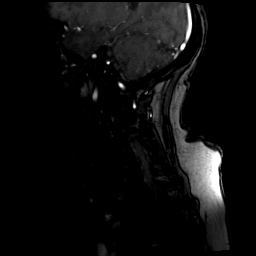
[im 44/70]
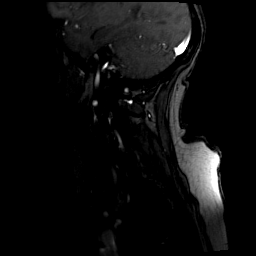
[im 47/70]
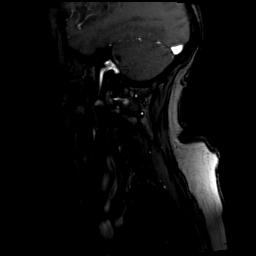
[im 49/70]
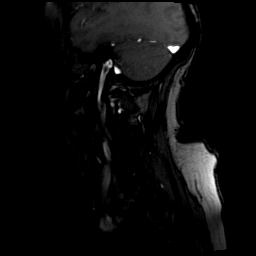
[im 52/70]
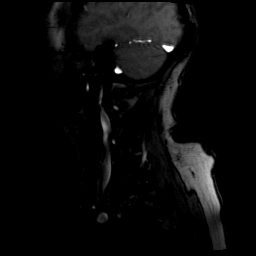
[im 55/70]
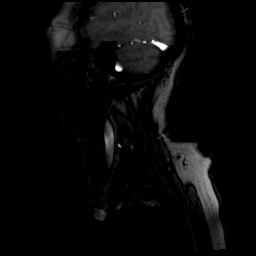
[im 58/70]
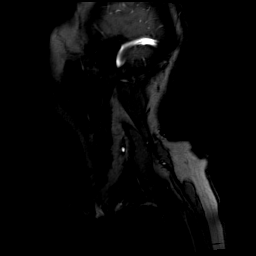
[im 61/70]
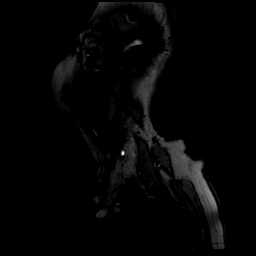
[im 64/70]
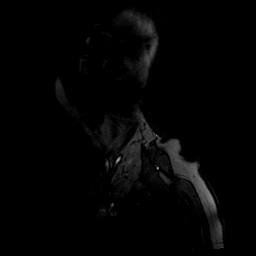
[im 67/70]
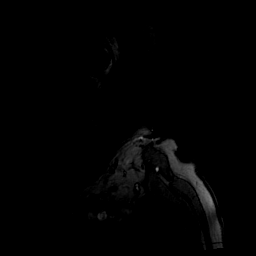
[im 70/70]
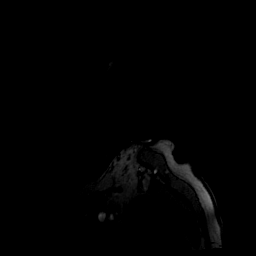

[Series 7: (id) · axial · 1.0mm · 0.52mm/px · z∈[-139,-83]mm · 19 of 60 slices shown (2 of 2)]
[im 1/60]
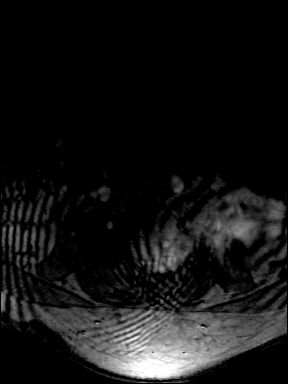
[im 3/60]
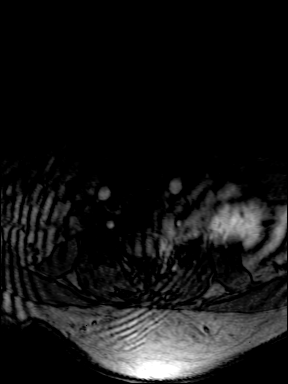
[im 6/60]
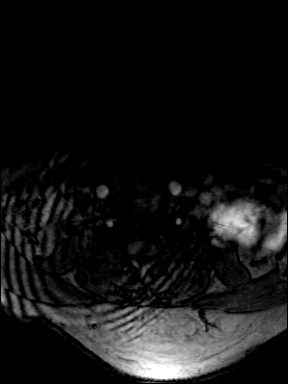
[im 9/60]
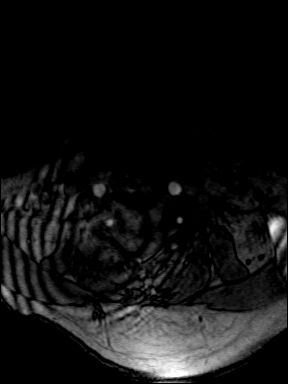
[im 12/60]
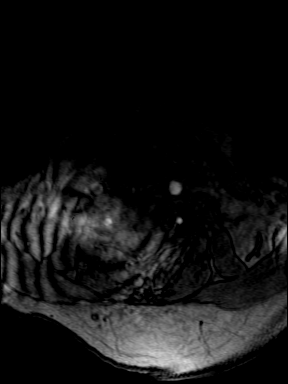
[im 15/60]
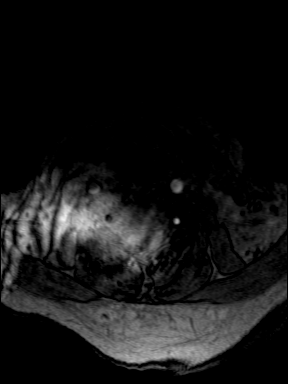
[im 18/60]
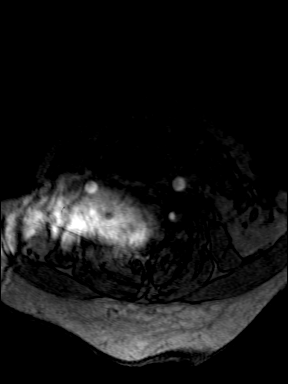
[im 21/60]
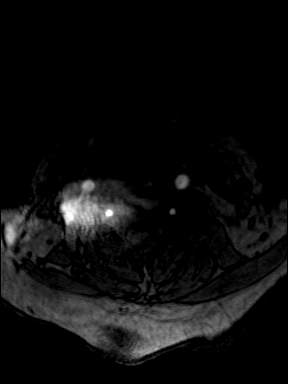
[im 24/60]
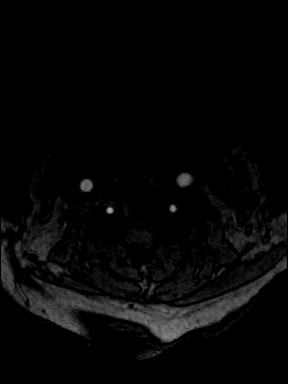
[im 27/60]
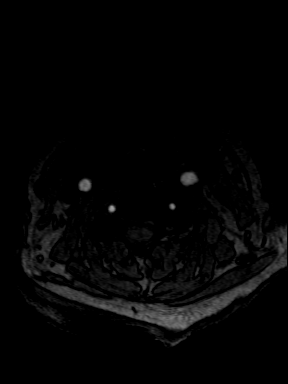
[im 30/60]
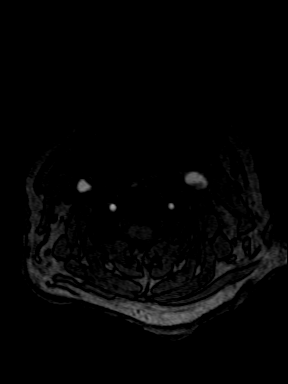
[im 33/60]
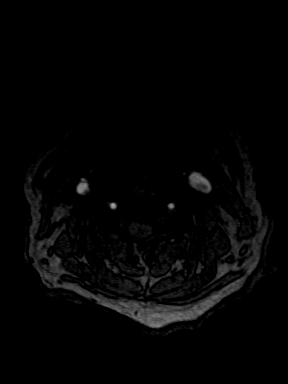
[im 36/60]
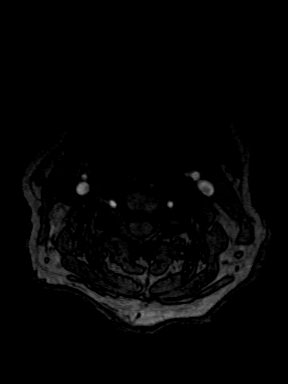
[im 39/60]
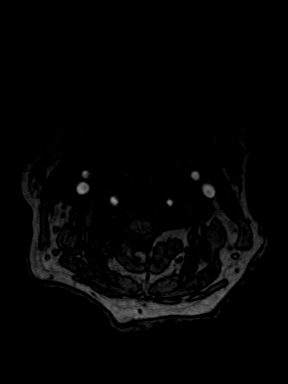
[im 42/60]
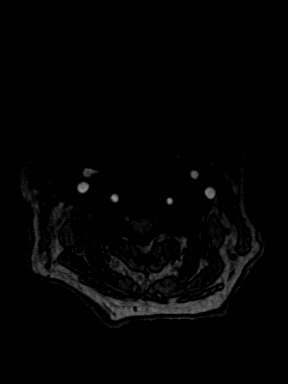
[im 45/60]
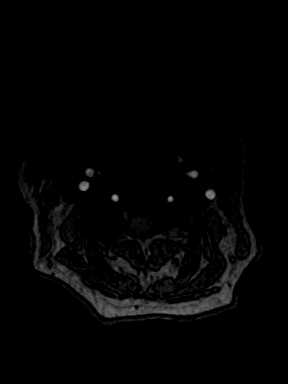
[im 48/60]
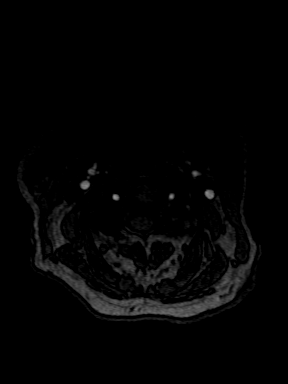
[im 51/60]
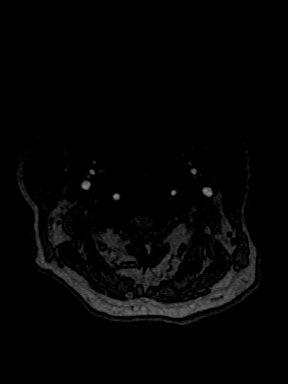
[im 57/60]
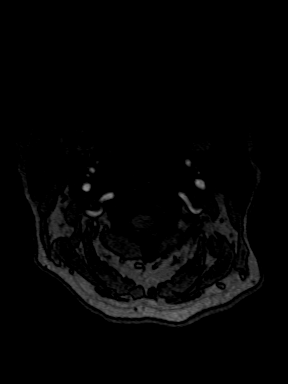

[Series 11: right · sagittal · 0.98mm/px · 1 of 3 slices shown]
[im 1/3]
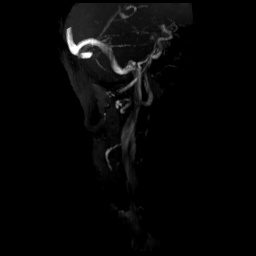

[Series 12: left · oblique · 0.98mm/px · 1 of 4 slices shown]
[im 1/4]
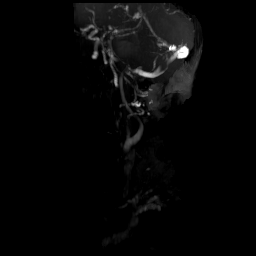

[46 of 48 positions shown; findings below may reference images not displayed]

FINDINGS: MRI HEAD FINDINGS

Brain: Cerebral volume within normal limits for age. No significant
cerebral white matter disease. No abnormal foci of restricted
diffusion to suggest acute or subacute ischemia. Gray-white matter
differentiation maintained. No encephalomalacia to suggest chronic
cortical infarction. No foci of susceptibility artifact to suggest
acute or chronic intracranial hemorrhage.

No mass lesion, midline shift or mass effect. Ventricles normal size
without hydrocephalus. No extra-axial fluid collection. Pituitary
gland suprasellar region normal. Midline structures intact.

Vascular: Major intracranial vascular flow voids are maintained.

Skull and upper cervical spine: Craniocervical junction within
normal limits. Bone marrow signal intensity normal. No scalp soft
tissue abnormality.

Sinuses/Orbits: Patient status post bilateral ocular lens
replacement. Globes and orbital soft tissues demonstrate no acute
finding. Paranasal sinuses are clear. No mastoid effusion. Inner ear
structures grossly normal.

Other: None.

MRA HEAD FINDINGS

ANTERIOR CIRCULATION:

Visualized distal cervical segments of the internal carotid arteries
are widely patent with symmetric antegrade flow. Petrous, cavernous,
and supraclinoid ICAs patent without stenosis or other abnormality.
A1 segments widely patent. Normal anterior communicating artery
complex. Anterior cerebral arteries widely patent to their distal
aspects. No M1 stenosis or occlusion. Normal MCA bifurcations.
Distal MCA branches well perfused and symmetric.

POSTERIOR CIRCULATION:

Vertebral arteries widely patent to the vertebrobasilar junction
without stenosis. Right vertebral artery slightly dominant. Patent
right PICA. Left PICA not seen. Basilar widely patent to its distal
aspect without stenosis. Superior cerebral arteries patent
bilaterally. Left PCA primarily supplied via the basilar. Right PCA
supplied via a hypoplastic right P1 segment and robust right
posterior communicating artery. PCAs well perfused to their distal
aspects without stenosis.

No intracranial aneurysm.

MRA NECK FINDINGS

Examination technically limited by lack of IV contrast.

AORTIC ARCH: Aortic arch and origin of the great vessels not well
assessed on this examination. Partially visualized subclavian
arteries widely patent.

RIGHT CAROTID SYSTEM: Visualized right CCA widely patent to the
bifurcation without stenosis. Mild atheromatous irregularity about
the right bifurcation without significant stenosis. Visualized right
ICA widely patent without stenosis or occlusion.

LEFT CAROTID SYSTEM: Visualized left CCA widely patent to the
bifurcation. No significant atheromatous narrowing about the left
bifurcation. Visualized left ICA widely patent without stenosis or
occlusion.

VERTEBRAL ARTERIES: Vertebral arteries not well assessed proximally.
Visualized portions of the vertebral arteries widely patent without
stenosis or occlusion.
IMPRESSION: MRI HEAD IMPRESSION:

Normal brain MRI for age. No acute intracranial abnormality. No
findings to explain patient's symptoms identified.

MRA HEAD IMPRESSION:

Normal intracranial MRA. No large vessel occlusion or
hemodynamically significant stenosis. No aneurysm.

MRA NECK IMPRESSION:

Grossly normal MRA of the neck. Please note examination is somewhat
technically limited by lack of IV contrast.

## 2020-06-22 DIAGNOSIS — E039 Hypothyroidism, unspecified: Secondary | ICD-10-CM | POA: Diagnosis not present

## 2020-06-22 DIAGNOSIS — E785 Hyperlipidemia, unspecified: Secondary | ICD-10-CM | POA: Diagnosis not present

## 2020-06-22 DIAGNOSIS — I10 Essential (primary) hypertension: Secondary | ICD-10-CM | POA: Diagnosis not present

## 2020-06-22 DIAGNOSIS — G4733 Obstructive sleep apnea (adult) (pediatric): Secondary | ICD-10-CM | POA: Diagnosis not present

## 2021-12-22 DIAGNOSIS — L820 Inflamed seborrheic keratosis: Secondary | ICD-10-CM | POA: Diagnosis not present

## 2021-12-22 DIAGNOSIS — L570 Actinic keratosis: Secondary | ICD-10-CM | POA: Diagnosis not present

## 2021-12-22 DIAGNOSIS — Z85828 Personal history of other malignant neoplasm of skin: Secondary | ICD-10-CM | POA: Diagnosis not present

## 2021-12-22 DIAGNOSIS — L821 Other seborrheic keratosis: Secondary | ICD-10-CM | POA: Diagnosis not present

## 2022-04-25 DIAGNOSIS — E785 Hyperlipidemia, unspecified: Secondary | ICD-10-CM | POA: Diagnosis not present

## 2022-04-25 DIAGNOSIS — G4733 Obstructive sleep apnea (adult) (pediatric): Secondary | ICD-10-CM | POA: Diagnosis not present

## 2022-04-25 DIAGNOSIS — E039 Hypothyroidism, unspecified: Secondary | ICD-10-CM | POA: Diagnosis not present

## 2022-04-25 DIAGNOSIS — I10 Essential (primary) hypertension: Secondary | ICD-10-CM | POA: Diagnosis not present

## 2023-01-18 DIAGNOSIS — L570 Actinic keratosis: Secondary | ICD-10-CM | POA: Diagnosis not present

## 2023-01-18 DIAGNOSIS — L218 Other seborrheic dermatitis: Secondary | ICD-10-CM | POA: Diagnosis not present

## 2023-01-18 DIAGNOSIS — D0462 Carcinoma in situ of skin of left upper limb, including shoulder: Secondary | ICD-10-CM | POA: Diagnosis not present

## 2023-01-18 DIAGNOSIS — D485 Neoplasm of uncertain behavior of skin: Secondary | ICD-10-CM | POA: Diagnosis not present

## 2023-01-18 DIAGNOSIS — L820 Inflamed seborrheic keratosis: Secondary | ICD-10-CM | POA: Diagnosis not present

## 2023-01-18 DIAGNOSIS — L821 Other seborrheic keratosis: Secondary | ICD-10-CM | POA: Diagnosis not present

## 2023-01-18 DIAGNOSIS — L578 Other skin changes due to chronic exposure to nonionizing radiation: Secondary | ICD-10-CM | POA: Diagnosis not present

## 2023-05-31 DIAGNOSIS — E785 Hyperlipidemia, unspecified: Secondary | ICD-10-CM | POA: Diagnosis not present

## 2023-05-31 DIAGNOSIS — Z780 Asymptomatic menopausal state: Secondary | ICD-10-CM | POA: Diagnosis not present

## 2023-05-31 DIAGNOSIS — I10 Essential (primary) hypertension: Secondary | ICD-10-CM | POA: Diagnosis not present

## 2023-05-31 DIAGNOSIS — M8580 Other specified disorders of bone density and structure, unspecified site: Secondary | ICD-10-CM | POA: Diagnosis not present

## 2023-05-31 DIAGNOSIS — Z86000 Personal history of in-situ neoplasm of breast: Secondary | ICD-10-CM | POA: Diagnosis not present

## 2023-05-31 DIAGNOSIS — E039 Hypothyroidism, unspecified: Secondary | ICD-10-CM | POA: Diagnosis not present

## 2023-05-31 DIAGNOSIS — G4733 Obstructive sleep apnea (adult) (pediatric): Secondary | ICD-10-CM | POA: Diagnosis not present

## 2023-06-01 ENCOUNTER — Other Ambulatory Visit: Payer: Self-pay | Admitting: Family Medicine

## 2023-06-01 ENCOUNTER — Encounter: Payer: Self-pay | Admitting: Family Medicine

## 2023-06-01 DIAGNOSIS — M8580 Other specified disorders of bone density and structure, unspecified site: Secondary | ICD-10-CM

## 2023-06-13 DIAGNOSIS — M9901 Segmental and somatic dysfunction of cervical region: Secondary | ICD-10-CM | POA: Diagnosis not present

## 2023-06-13 DIAGNOSIS — M9902 Segmental and somatic dysfunction of thoracic region: Secondary | ICD-10-CM | POA: Diagnosis not present

## 2023-06-13 DIAGNOSIS — M7551 Bursitis of right shoulder: Secondary | ICD-10-CM | POA: Diagnosis not present

## 2023-06-13 DIAGNOSIS — M62411 Contracture of muscle, right shoulder: Secondary | ICD-10-CM | POA: Diagnosis not present

## 2023-06-20 DIAGNOSIS — M7551 Bursitis of right shoulder: Secondary | ICD-10-CM | POA: Diagnosis not present

## 2023-06-20 DIAGNOSIS — M9902 Segmental and somatic dysfunction of thoracic region: Secondary | ICD-10-CM | POA: Diagnosis not present

## 2023-06-20 DIAGNOSIS — M9901 Segmental and somatic dysfunction of cervical region: Secondary | ICD-10-CM | POA: Diagnosis not present

## 2023-06-20 DIAGNOSIS — M62411 Contracture of muscle, right shoulder: Secondary | ICD-10-CM | POA: Diagnosis not present

## 2023-07-04 DIAGNOSIS — M9901 Segmental and somatic dysfunction of cervical region: Secondary | ICD-10-CM | POA: Diagnosis not present

## 2023-07-04 DIAGNOSIS — M9902 Segmental and somatic dysfunction of thoracic region: Secondary | ICD-10-CM | POA: Diagnosis not present

## 2023-07-04 DIAGNOSIS — M7551 Bursitis of right shoulder: Secondary | ICD-10-CM | POA: Diagnosis not present

## 2023-07-04 DIAGNOSIS — M62411 Contracture of muscle, right shoulder: Secondary | ICD-10-CM | POA: Diagnosis not present

## 2023-08-02 ENCOUNTER — Encounter: Payer: Self-pay | Admitting: Family Medicine

## 2023-09-04 DIAGNOSIS — M79642 Pain in left hand: Secondary | ICD-10-CM | POA: Diagnosis not present

## 2023-10-04 DIAGNOSIS — M19042 Primary osteoarthritis, left hand: Secondary | ICD-10-CM | POA: Diagnosis not present

## 2023-10-26 DIAGNOSIS — J209 Acute bronchitis, unspecified: Secondary | ICD-10-CM | POA: Diagnosis not present

## 2023-10-26 DIAGNOSIS — Z9181 History of falling: Secondary | ICD-10-CM | POA: Diagnosis not present

## 2023-11-08 DIAGNOSIS — Z86000 Personal history of in-situ neoplasm of breast: Secondary | ICD-10-CM | POA: Diagnosis not present

## 2023-11-08 DIAGNOSIS — E785 Hyperlipidemia, unspecified: Secondary | ICD-10-CM | POA: Diagnosis not present

## 2023-11-08 DIAGNOSIS — I10 Essential (primary) hypertension: Secondary | ICD-10-CM | POA: Diagnosis not present

## 2023-11-08 DIAGNOSIS — Z780 Asymptomatic menopausal state: Secondary | ICD-10-CM | POA: Diagnosis not present

## 2023-11-08 DIAGNOSIS — M8580 Other specified disorders of bone density and structure, unspecified site: Secondary | ICD-10-CM | POA: Diagnosis not present

## 2023-11-08 DIAGNOSIS — Z1211 Encounter for screening for malignant neoplasm of colon: Secondary | ICD-10-CM | POA: Diagnosis not present

## 2023-11-08 DIAGNOSIS — E039 Hypothyroidism, unspecified: Secondary | ICD-10-CM | POA: Diagnosis not present

## 2023-11-08 DIAGNOSIS — Z0000 Encounter for general adult medical examination without abnormal findings: Secondary | ICD-10-CM | POA: Diagnosis not present

## 2023-12-13 DIAGNOSIS — M19042 Primary osteoarthritis, left hand: Secondary | ICD-10-CM | POA: Diagnosis not present

## 2024-01-11 ENCOUNTER — Other Ambulatory Visit: Payer: Self-pay | Admitting: Family Medicine

## 2024-01-11 DIAGNOSIS — M8580 Other specified disorders of bone density and structure, unspecified site: Secondary | ICD-10-CM

## 2024-01-29 DIAGNOSIS — M19042 Primary osteoarthritis, left hand: Secondary | ICD-10-CM | POA: Diagnosis not present

## 2024-01-31 DIAGNOSIS — L821 Other seborrheic keratosis: Secondary | ICD-10-CM | POA: Diagnosis not present

## 2024-01-31 DIAGNOSIS — D225 Melanocytic nevi of trunk: Secondary | ICD-10-CM | POA: Diagnosis not present

## 2024-01-31 DIAGNOSIS — L438 Other lichen planus: Secondary | ICD-10-CM | POA: Diagnosis not present

## 2024-01-31 DIAGNOSIS — L570 Actinic keratosis: Secondary | ICD-10-CM | POA: Diagnosis not present

## 2024-02-19 DIAGNOSIS — L820 Inflamed seborrheic keratosis: Secondary | ICD-10-CM | POA: Diagnosis not present

## 2024-02-19 DIAGNOSIS — D485 Neoplasm of uncertain behavior of skin: Secondary | ICD-10-CM | POA: Diagnosis not present

## 2024-04-22 DIAGNOSIS — L570 Actinic keratosis: Secondary | ICD-10-CM | POA: Diagnosis not present

## 2024-04-22 DIAGNOSIS — L738 Other specified follicular disorders: Secondary | ICD-10-CM | POA: Diagnosis not present

## 2024-09-09 ENCOUNTER — Other Ambulatory Visit: Payer: Medicare Other
